# Patient Record
Sex: Female | Born: 1965 | Race: White | Hispanic: No | Smoking: Never smoker
Health system: Southern US, Community
[De-identification: ages and names within clinical notes are randomized; demographics above are authoritative.]

## PROBLEM LIST (undated history)

## (undated) DIAGNOSIS — G4730 Sleep apnea, unspecified: Secondary | ICD-10-CM

## (undated) DIAGNOSIS — G4700 Insomnia, unspecified: Secondary | ICD-10-CM

## (undated) DIAGNOSIS — F419 Anxiety disorder, unspecified: Secondary | ICD-10-CM

## (undated) DIAGNOSIS — K219 Gastro-esophageal reflux disease without esophagitis: Secondary | ICD-10-CM

## (undated) DIAGNOSIS — F32A Depression, unspecified: Secondary | ICD-10-CM

## (undated) DIAGNOSIS — K5900 Constipation, unspecified: Secondary | ICD-10-CM

## (undated) DIAGNOSIS — E785 Hyperlipidemia, unspecified: Secondary | ICD-10-CM

## (undated) DIAGNOSIS — I10 Essential (primary) hypertension: Secondary | ICD-10-CM

## (undated) DIAGNOSIS — E039 Hypothyroidism, unspecified: Secondary | ICD-10-CM

## (undated) HISTORY — PX: CHOLECYSTECTOMY: SHX55

---

## 1997-12-04 ENCOUNTER — Ambulatory Visit (HOSPITAL_COMMUNITY): Admission: RE | Admit: 1997-12-04 | Discharge: 1997-12-04 | Payer: Self-pay | Admitting: Family Medicine

## 1998-07-15 ENCOUNTER — Inpatient Hospital Stay (HOSPITAL_COMMUNITY): Admission: AD | Admit: 1998-07-15 | Discharge: 1998-07-15 | Payer: Self-pay | Admitting: Obstetrics and Gynecology

## 1998-07-28 ENCOUNTER — Inpatient Hospital Stay (HOSPITAL_COMMUNITY): Admission: AD | Admit: 1998-07-28 | Discharge: 1998-07-28 | Payer: Self-pay | Admitting: Obstetrics & Gynecology

## 1998-08-06 ENCOUNTER — Encounter: Admission: RE | Admit: 1998-08-06 | Discharge: 1998-11-04 | Payer: Self-pay | Admitting: Obstetrics and Gynecology

## 1998-10-06 ENCOUNTER — Inpatient Hospital Stay (HOSPITAL_COMMUNITY): Admission: AD | Admit: 1998-10-06 | Discharge: 1998-10-09 | Payer: Self-pay | Admitting: Obstetrics and Gynecology

## 1998-10-15 ENCOUNTER — Inpatient Hospital Stay (HOSPITAL_COMMUNITY): Admission: AD | Admit: 1998-10-15 | Discharge: 1998-10-15 | Payer: Self-pay | Admitting: Obstetrics and Gynecology

## 1998-11-29 ENCOUNTER — Other Ambulatory Visit: Admission: RE | Admit: 1998-11-29 | Discharge: 1998-11-29 | Payer: Self-pay | Admitting: Obstetrics and Gynecology

## 1999-12-02 ENCOUNTER — Other Ambulatory Visit: Admission: RE | Admit: 1999-12-02 | Discharge: 1999-12-02 | Payer: Self-pay | Admitting: Obstetrics and Gynecology

## 2000-06-12 ENCOUNTER — Emergency Department (HOSPITAL_COMMUNITY): Admission: EM | Admit: 2000-06-12 | Discharge: 2000-06-12 | Payer: Self-pay | Admitting: Emergency Medicine

## 2000-06-12 ENCOUNTER — Encounter: Payer: Self-pay | Admitting: Emergency Medicine

## 2000-11-10 ENCOUNTER — Other Ambulatory Visit: Admission: RE | Admit: 2000-11-10 | Discharge: 2000-11-10 | Payer: Self-pay | Admitting: Obstetrics and Gynecology

## 2000-11-30 ENCOUNTER — Encounter: Admission: RE | Admit: 2000-11-30 | Discharge: 2001-02-28 | Payer: Self-pay | Admitting: Obstetrics and Gynecology

## 2001-05-20 ENCOUNTER — Inpatient Hospital Stay (HOSPITAL_COMMUNITY): Admission: AD | Admit: 2001-05-20 | Discharge: 2001-05-23 | Payer: Self-pay | Admitting: Obstetrics and Gynecology

## 2002-01-26 ENCOUNTER — Other Ambulatory Visit: Admission: RE | Admit: 2002-01-26 | Discharge: 2002-01-26 | Payer: Self-pay | Admitting: Obstetrics and Gynecology

## 2003-11-05 ENCOUNTER — Other Ambulatory Visit: Admission: RE | Admit: 2003-11-05 | Discharge: 2003-11-05 | Payer: Self-pay | Admitting: Obstetrics and Gynecology

## 2004-01-11 ENCOUNTER — Ambulatory Visit (HOSPITAL_COMMUNITY): Admission: RE | Admit: 2004-01-11 | Discharge: 2004-01-11 | Payer: Self-pay | Admitting: Obstetrics and Gynecology

## 2006-01-29 ENCOUNTER — Encounter: Admission: RE | Admit: 2006-01-29 | Discharge: 2006-04-29 | Payer: Self-pay | Admitting: Orthopedic Surgery

## 2011-11-14 ENCOUNTER — Inpatient Hospital Stay: Payer: Self-pay | Admitting: Surgery

## 2011-11-14 LAB — CBC
HCT: 44.5 % (ref 35.0–47.0)
HGB: 15 g/dL (ref 12.0–16.0)
MCH: 26.2 pg (ref 26.0–34.0)
MCHC: 33.6 g/dL (ref 32.0–36.0)
MCV: 78 fL — ABNORMAL LOW (ref 80–100)
Platelet: 361 10*3/uL (ref 150–440)
RBC: 5.7 10*6/uL — ABNORMAL HIGH (ref 3.80–5.20)
RDW: 13.8 % (ref 11.5–14.5)
WBC: 12.3 10*3/uL — ABNORMAL HIGH (ref 3.6–11.0)

## 2011-11-14 LAB — COMPREHENSIVE METABOLIC PANEL
Albumin: 3.8 g/dL (ref 3.4–5.0)
Alkaline Phosphatase: 166 U/L — ABNORMAL HIGH (ref 50–136)
Anion Gap: 10 (ref 7–16)
BUN: 10 mg/dL (ref 7–18)
Bilirubin,Total: 0.4 mg/dL (ref 0.2–1.0)
Calcium, Total: 9.1 mg/dL (ref 8.5–10.1)
Chloride: 105 mmol/L (ref 98–107)
Co2: 24 mmol/L (ref 21–32)
Creatinine: 0.94 mg/dL (ref 0.60–1.30)
EGFR (African American): >60
EGFR (Non-African Amer.): >60
Glucose: 133 mg/dL — ABNORMAL HIGH (ref 65–99)
Osmolality: 279 (ref 275–301)
Potassium: 3.9 mmol/L (ref 3.5–5.1)
SGOT(AST): 53 U/L — ABNORMAL HIGH (ref 15–37)
SGPT (ALT): 49 U/L (ref 12–78)
Sodium: 139 mmol/L (ref 136–145)
Total Protein: 8.6 g/dL — ABNORMAL HIGH (ref 6.4–8.2)

## 2011-11-14 LAB — URINALYSIS, COMPLETE
Bilirubin,UR: NEGATIVE
Glucose,UR: NEGATIVE mg/dL (ref 0–75)
Ketone: NEGATIVE
Leukocyte Esterase: NEGATIVE
Nitrite: NEGATIVE
Ph: 5 (ref 4.5–8.0)
Protein: 100
RBC,UR: <1 /HPF (ref 0–5)
Specific Gravity: 1.018 (ref 1.003–1.030)
Squamous Epithelial: 2
WBC UR: 2 /HPF (ref 0–5)

## 2011-11-14 LAB — LIPASE, BLOOD: Lipase: 2759 U/L — ABNORMAL HIGH (ref 73–393)

## 2011-11-14 LAB — TROPONIN I: Troponin-I: <0.02 ng/mL

## 2011-11-14 LAB — PREGNANCY, URINE: Pregnancy Test, Urine: NEGATIVE m[IU]/mL

## 2011-11-15 LAB — COMPREHENSIVE METABOLIC PANEL
Albumin: 3 g/dL — ABNORMAL LOW (ref 3.4–5.0)
Alkaline Phosphatase: 129 U/L (ref 50–136)
Anion Gap: 7 (ref 7–16)
BUN: 7 mg/dL (ref 7–18)
Bilirubin,Total: 0.6 mg/dL (ref 0.2–1.0)
Calcium, Total: 8.2 mg/dL — ABNORMAL LOW (ref 8.5–10.1)
Chloride: 108 mmol/L — ABNORMAL HIGH (ref 98–107)
Co2: 25 mmol/L (ref 21–32)
Creatinine: 0.87 mg/dL (ref 0.60–1.30)
EGFR (African American): >60
EGFR (Non-African Amer.): >60
Glucose: 122 mg/dL — ABNORMAL HIGH (ref 65–99)
Osmolality: 279 (ref 275–301)
Potassium: 3.7 mmol/L (ref 3.5–5.1)
SGOT(AST): 20 U/L (ref 15–37)
SGPT (ALT): 29 U/L (ref 12–78)
Sodium: 140 mmol/L (ref 136–145)
Total Protein: 6.8 g/dL (ref 6.4–8.2)

## 2011-11-15 LAB — CBC WITH DIFFERENTIAL/PLATELET
Basophil #: 0.1 10*3/uL (ref 0.0–0.1)
Basophil %: 0.7 %
Eosinophil #: 0.1 10*3/uL (ref 0.0–0.7)
Eosinophil %: 1.5 %
HCT: 38.5 % (ref 35.0–47.0)
HGB: 13 g/dL (ref 12.0–16.0)
Lymphocyte #: 2.8 10*3/uL (ref 1.0–3.6)
Lymphocyte %: 29.4 %
MCH: 26.8 pg (ref 26.0–34.0)
MCHC: 33.8 g/dL (ref 32.0–36.0)
MCV: 79 fL — ABNORMAL LOW (ref 80–100)
Monocyte #: 0.6 x10 3/mm (ref 0.2–0.9)
Monocyte %: 5.8 %
Neutrophil #: 6 10*3/uL (ref 1.4–6.5)
Neutrophil %: 62.6 %
Platelet: 240 10*3/uL (ref 150–440)
RBC: 4.86 10*6/uL (ref 3.80–5.20)
RDW: 14.1 % (ref 11.5–14.5)
WBC: 9.7 10*3/uL (ref 3.6–11.0)

## 2011-11-15 LAB — AMYLASE: Amylase: 42 U/L (ref 25–115)

## 2011-11-15 LAB — LIPASE, BLOOD: Lipase: 179 U/L (ref 73–393)

## 2011-11-16 LAB — COMPREHENSIVE METABOLIC PANEL
Albumin: 2.9 g/dL — ABNORMAL LOW (ref 3.4–5.0)
Anion Gap: 9 (ref 7–16)
Calcium, Total: 8.3 mg/dL — ABNORMAL LOW (ref 8.5–10.1)
Chloride: 106 mmol/L (ref 98–107)
EGFR (African American): >60
Glucose: 103 mg/dL — ABNORMAL HIGH (ref 65–99)
Potassium: 3.6 mmol/L (ref 3.5–5.1)
SGOT(AST): 29 U/L (ref 15–37)
SGPT (ALT): 34 U/L (ref 12–78)

## 2011-11-16 LAB — CBC WITH DIFFERENTIAL/PLATELET
Basophil #: 0.1 10*3/uL (ref 0.0–0.1)
Basophil %: 0.9 %
Eosinophil %: 3.7 %
Lymphocyte #: 2.2 10*3/uL (ref 1.0–3.6)
MCH: 26.8 pg (ref 26.0–34.0)
MCHC: 33.9 g/dL (ref 32.0–36.0)
MCV: 79 fL — ABNORMAL LOW (ref 80–100)
Monocyte #: 0.5 x10 3/mm (ref 0.2–0.9)
Platelet: 231 10*3/uL (ref 150–440)
RBC: 4.81 10*6/uL (ref 3.80–5.20)
RDW: 14.3 % (ref 11.5–14.5)

## 2011-11-16 LAB — AMYLASE: Amylase: 23 U/L — ABNORMAL LOW (ref 25–115)

## 2011-11-17 LAB — COMPREHENSIVE METABOLIC PANEL
Albumin: 2.9 g/dL — ABNORMAL LOW (ref 3.4–5.0)
Anion Gap: 6 — ABNORMAL LOW (ref 7–16)
Bilirubin,Total: 0.3 mg/dL (ref 0.2–1.0)
Calcium, Total: 8.2 mg/dL — ABNORMAL LOW (ref 8.5–10.1)
Chloride: 106 mmol/L (ref 98–107)
Co2: 27 mmol/L (ref 21–32)
Creatinine: 0.77 mg/dL (ref 0.60–1.30)
EGFR (African American): >60
EGFR (Non-African Amer.): >60
Glucose: 111 mg/dL — ABNORMAL HIGH (ref 65–99)
Potassium: 3.7 mmol/L (ref 3.5–5.1)
SGOT(AST): 27 U/L (ref 15–37)
Sodium: 139 mmol/L (ref 136–145)

## 2011-11-17 LAB — LIPASE, BLOOD: Lipase: 84 U/L (ref 73–393)

## 2011-11-17 LAB — CBC WITH DIFFERENTIAL/PLATELET
Basophil #: 0.1 10*3/uL (ref 0.0–0.1)
Eosinophil #: 0.3 10*3/uL (ref 0.0–0.7)
Eosinophil %: 3.7 %
HCT: 38.6 % (ref 35.0–47.0)
MCH: 26.3 pg (ref 26.0–34.0)
MCHC: 33.6 g/dL (ref 32.0–36.0)
Monocyte #: 0.4 x10 3/mm (ref 0.2–0.9)
Neutrophil %: 64.9 %
Platelet: 233 10*3/uL (ref 150–440)
RBC: 4.94 10*6/uL (ref 3.80–5.20)
RDW: 14.1 % (ref 11.5–14.5)
WBC: 8.4 10*3/uL (ref 3.6–11.0)

## 2011-11-17 LAB — AMYLASE: Amylase: 21 U/L — ABNORMAL LOW (ref 25–115)

## 2011-12-14 ENCOUNTER — Ambulatory Visit: Payer: Self-pay | Admitting: Surgery

## 2011-12-14 LAB — BASIC METABOLIC PANEL
BUN: 10 mg/dL (ref 7–18)
Calcium, Total: 9 mg/dL (ref 8.5–10.1)
Co2: 27 mmol/L (ref 21–32)
EGFR (African American): >60
Glucose: 90 mg/dL (ref 65–99)
Osmolality: 274 (ref 275–301)
Potassium: 3.8 mmol/L (ref 3.5–5.1)

## 2011-12-14 LAB — CBC WITH DIFFERENTIAL/PLATELET
Basophil #: 0.1 10*3/uL (ref 0.0–0.1)
HCT: 42.6 % (ref 35.0–47.0)
Lymphocyte #: 2.3 10*3/uL (ref 1.0–3.6)
MCH: 26.4 pg (ref 26.0–34.0)
MCV: 78 fL — ABNORMAL LOW (ref 80–100)
Monocyte #: 0.4 x10 3/mm (ref 0.2–0.9)
Monocyte %: 4.5 %
Neutrophil #: 5.9 10*3/uL (ref 1.4–6.5)
Neutrophil %: 67.2 %
Platelet: 293 10*3/uL (ref 150–440)
RDW: 14 % (ref 11.5–14.5)
WBC: 8.9 10*3/uL (ref 3.6–11.0)

## 2011-12-14 LAB — HEPATIC FUNCTION PANEL A (ARMC)
Albumin: 3.5 g/dL (ref 3.4–5.0)
Bilirubin, Direct: <0.1 mg/dL (ref 0.00–0.20)
Bilirubin,Total: 0.4 mg/dL (ref 0.2–1.0)
SGOT(AST): 17 U/L (ref 15–37)
Total Protein: 7.9 g/dL (ref 6.4–8.2)

## 2011-12-14 LAB — LIPASE, BLOOD: Lipase: 67 U/L — ABNORMAL LOW (ref 73–393)

## 2011-12-23 ENCOUNTER — Ambulatory Visit: Payer: Self-pay | Admitting: Surgery

## 2011-12-24 LAB — CBC WITH DIFFERENTIAL/PLATELET
Basophil %: 0.2 %
Eosinophil #: 0 10*3/uL (ref 0.0–0.7)
Eosinophil %: 0 %
HCT: 36.3 % (ref 35.0–47.0)
HGB: 12.5 g/dL (ref 12.0–16.0)
Lymphocyte #: 1.1 10*3/uL (ref 1.0–3.6)
Lymphocyte %: 12.9 %
MCH: 27.1 pg (ref 26.0–34.0)
MCHC: 34.4 g/dL (ref 32.0–36.0)
MCV: 79 fL — ABNORMAL LOW (ref 80–100)
Monocyte #: 0.2 x10 3/mm (ref 0.2–0.9)
Neutrophil #: 7.3 10*3/uL — ABNORMAL HIGH (ref 1.4–6.5)
RDW: 14.4 % (ref 11.5–14.5)

## 2011-12-24 LAB — COMPREHENSIVE METABOLIC PANEL
Albumin: 3.1 g/dL — ABNORMAL LOW (ref 3.4–5.0)
Calcium, Total: 8.4 mg/dL — ABNORMAL LOW (ref 8.5–10.1)
Chloride: 105 mmol/L (ref 98–107)
Co2: 24 mmol/L (ref 21–32)
Creatinine: 0.93 mg/dL (ref 0.60–1.30)
EGFR (African American): >60
Osmolality: 279 (ref 275–301)
Potassium: 4.3 mmol/L (ref 3.5–5.1)
Sodium: 139 mmol/L (ref 136–145)
Total Protein: 6.8 g/dL (ref 6.4–8.2)

## 2011-12-25 LAB — PATHOLOGY REPORT

## 2013-05-30 ENCOUNTER — Emergency Department: Payer: Self-pay | Admitting: Emergency Medicine

## 2014-07-03 NOTE — H&P (Signed)
PATIENT NAME:  Veronica Harding, BOUTELLE MR#:  [id number] DATE OF BIRTH:  29-Oct-1965  DATE OF ADMISSION:  11/14/2011  HISTORY OF PRESENT ILLNESS: Ms. Goelz is an otherwise, healthy 49 year old white female who began experiencing vomiting last night at about 10:00 p.m. (14 hours ago). She has vomited 5 or 6 times in the last 14 hours and some of her vomiting has awoken her. In addition to this, she has had significant epigastric pain that radiates down to the supraumbilical area and across both sides of her upper abdomen and along her spine in her back. She denies fever, chills, change in the color of her urine, eyes, skin, and stool. She did have a bowel movement within the last 24 hours. She has never had an episode like this in the past.   PAST MEDICAL HISTORY:  1. Morbid obesity.  2. Remote history of depression.   MEDICATIONS: None.   ALLERGIES: None.   SOCIAL HISTORY: The patient has been divorced for eight years. She lives with her two boys that are 75 and 47 years old, respectively. She has social support system with her sister and her mother living close by. She does not smoke cigarettes and never has. She does not drink alcohol and never has. She has not worked since 2005. She has done occasional odd jobs and went back to school initially, but essentially has been unemployed since her divorce.   REVIEW OF SYSTEMS: Negative for 10 systems except as mentioned above in the history of present illness. Specifically, the patient denies any symptoms of jaundice or hyperbilirubinemia or urea. The only real positive in the review of systems is that the patient has had intermittent depression and did use multiple medications for depression in the past that were dispensed by her family physician, but she did not feel that any of these worked well. She has not had any significant counseling for her depression.   FAMILY HISTORY: Noncontributory.   PHYSICAL EXAMINATION:  GENERAL: Morbidly obese young to  middle-aged white female lying comfortably in the Emergency Department stretcher. Height 5 feet, 0 inches, weight 285 pounds, BMI 55.7.   VITAL SIGNS: Temperature 98.0, pulse 95, respirations 18, blood pressure 211/104, oxygen saturation 98%.   HEENT: Pupils equally round and reactive to light. Extraocular movements intact. Sclerae anicteric. Oropharynx clear. Hearing intact to voice. Mucous membranes moist.   NECK: Supple with no jugular venous distention or tracheal deviation.   HEART: Regular rate and rhythm with no murmurs or rubs.   LUNGS: Clear to auscultation with normal respiratory effort bilaterally.   ABDOMEN: Obese and mildly tender to deep palpation in the epigastrium. There is no hepatosplenomegaly or other masses.   EXTREMITIES: No edema with normal capillary refill bilaterally.   NEUROLOGIC: Cranial nerves II through XII, motor and sensation grossly intact.   PSYCHIATRIC: Alert and oriented x4. Appropriate affect.   LABORATORY, DIAGNOSTIC AND RADIOLOGICAL DATA: Electrolytes normal. SGOT 53. Alkaline phosphatase 166, bilirubin 0.4, amylase 2,759. White blood cell count 12, hemoglobin 15, hematocrit 44.5%, platelet count 361,000. Urinalysis and urine pregnancy test are normal. Ultrasound of the abdomen reveals a stone lodged in the neck of the gallbladder with no pericholecystic fluid, but the gallbladder wall is minimally thickened at 4 mm and the common bile duct is dilated at 7.5 mm. There is no intrahepatic ductal dilatation.   ASSESSMENT:  1. Gallstone pancreatitis with possible associated hydrops of the gallbladder.  2. Morbid obesity.  3. Possible undiagnosed hypertension.     PLAN: Admit  to the hospital for IV fluid hydration, n.p.o. status, and once her gallstone pancreatitis resolves, an interval laparoscopic cholecystectomy.    ____________________________ Deago Burruss F. Dierks Wach, MD wfm:ap Claude Manges: 11/14/2011 12:12:36 ET T: 11/14/2011 12:32:25  ET JOB#: [id number]  cc: Claude MangesWilliam F. Raylea Adcox, MD, <Dictator>  Claude MangesWILLIAM F Aidynn Krenn MD ELECTRONICALLY SIGNED 11/15/2011 11:20

## 2014-07-03 NOTE — Discharge Summary (Signed)
PATIENT NAME:  Veronica Harding, Nana M MR#:  [id number] DATE OF BIRTH:  1965-12-10  DATE OF ADMISSION:  11/14/2011 DATE OF DISCHARGE:  11/17/2011  DISCHARGE DIAGNOSES:  1. Biliary pancreatitis. 2. Gallstones.  3. Morbid obesity. 4. Depression.   PROCEDURES: None.   CONSULTANTS: None.   HISTORY OF PRESENT ILLNESS/HOSPITAL COURSE: This is a patient with laboratory, physical finding, and history suggestive of biliary pancreatitis. She was admitted to the hospital with an elevated lipase, which promptly returned to normal as did her liver function tests. Because of her low albumin, significant pancreatitis, and morbid obesity it was decided that with the patient tolerating a low fat diet that she would be discharged in stable condition to follow up in my office in one week, at which time we would discuss elective laparoscopic cholecystectomy with cholangiography. The rationale for this was discussed. Due to her significant pancreatitis, even though her lipase is back to normal, we suspect that she has significant inflammatory process and that surgery risks including conversion to an open procedure would be higher at this time. The patient understood and agreed with this plan. She will be discharged with Vicodin for pain p.r.n. and to resume her regular medications and follow up in my office in 10 days.  ____________________________ Adah Salvageichard E. Excell Seltzerooper, MD rec:rbg D: 11/17/2011 10:28:18 ET T: 11/18/2011 11:12:33 ET JOB#: [id number]  cc: Adah Salvageichard E. Excell Seltzerooper, MD, <Dictator> Lattie HawICHARD E Jacqulyne Gladue MD ELECTRONICALLY SIGNED 11/18/2011 18:38

## 2014-07-03 NOTE — Op Note (Signed)
PATIENT NAME:  Veronica Harding, Veronica Harding MR#:  [id number] DATE OF BIRTH:  12-Apr-1965  DATE OF PROCEDURE:  12/23/2011  PREOPERATIVE DIAGNOSIS: Biliary colic with history of biliary pancreatitis.   POSTOPERATIVE DIAGNOSIS: Biliary colic with history of biliary pancreatitis.   PROCEDURE: Laparoscopic cholecystectomy with C-arm fluoroscopic cholangiography.   SURGEON: Karolee Meloni E. Excell Seltzerooper, MD  ASSISTANT: Ronaldo MiyamotoKyle Puscy, PAS   ANESTHESIA: General with endotracheal tube.  INDICATIONS: This is a patient with episodes of biliary pancreatitis. She has not had any attacks in approximately one month. She is here for elective laparoscopic cholecystectomy with cholangiography.   Preoperatively we discussed rationale for surgery, the options of observation, risk of bleeding, infection, recurrence of disease, retained common bile duct stone, and bile duct leak any of which could require further surgery and/or ERCP, stent, and papillotomy. This was all reviewed for she and her husband and other family members in the preop holding area. She understood and agreed to proceed.   FINDINGS: C-arm fluoroscopic cholangiography demonstrated good flow in the duodenum. No intraluminal filling defects. The pancreatic duct filled. The cystic duct had been cannulated and the proximal bile ducts were well identified.   DESCRIPTION OF PROCEDURE: The patient was induced to general anesthesia, given IV antibiotics, and VTE prophylaxis was in place. She was prepped and draped in a sterile fashion. Marcaine was infiltrated in the skin and subcutaneous tissues around the periumbilical area. Because of her morbid obesity, the extra long bariatric Veress needle was utilized through an incision, in the supraumbilical area, to obtain a pneumoperitoneum. Once this was obtained, an extra long bariatric 5 mm port was placed and the camera was utilized to explore the abdominal cavity. No sign of bowel injury or adhesions were identified.   Under direct  vision, a 10 mm epigastric port and two lateral 5 mm ports were placed. The gallbladder was placed on tension. The peritoneum over the infundibulum was incised bluntly. The cystic duct gallbladder junction was well identified. The cystic artery was doubly clipped and divided and then the cystic duct at the infundibulum was clipped and incised through a separate incision and an Angiocath cholangiogram catheter was placed. C-arm fluoroscopic cholangiography demonstrated the above. The cholangiogram catheter was removed. The cystic duct was doubly clipped and divided and then a second branch of the cystic artery was doubly clipped and divided and the gallbladder was taken from the gallbladder fossa with electrocautery and passed out through the epigastric port site with the aid of an Endo Catch bag. The area was checked for hemostasis and found to be adequate. There was no sign of bleeding, bile leak, or bowel injury. Therefore with the camera in the epigastric site to view back to the periumbilical site, there was no sign of bowel injury or adhesions and pneumoperitoneum was released. All ports were removed. An attempt at closure of the epigastric port with figure-of-eight 0 Vicryls was performed. This was made extremely difficult by her massive morbid obesity, but I believe the fascia was approximated. Then 4-0 subcuticular Monocryl was used on all skin edges. Steri-Strips, Mastisol, and sterile dressing was placed.   The patient tolerated the procedure well. There were no complications. She was taken to the recovery room in stable condition to be admitted for observation. ____________________________ Adah Salvageichard E. Excell Seltzerooper, MD rec:slb D: 12/23/2011 09:22:39 ET T: 12/23/2011 09:31:41 ET JOB#: [id number]  cc: Adah Salvageichard E. Excell Seltzerooper, MD, <Dictator> Lattie HawICHARD E Emmelina Mcloughlin MD ELECTRONICALLY SIGNED 12/23/2011 15:37

## 2014-07-03 NOTE — Discharge Summary (Signed)
PATIENT NAME:  Veronica Harding, Neala M MR#:  [id number] DATE OF BIRTH:  1965/12/17  DATE OF ADMISSION:  12/23/2011 DATE OF DISCHARGE:  12/24/2011  DIAGNOSES:  1. History of biliary pancreatitis.  2. Gallstones.  3. Morbid obesity.  4. Hypertension.   PROCEDURES: Laparoscopic cholecystectomy with cholangiography.   HISTORY OF PRESENT ILLNESS/HOSPITAL COURSE: This is a patient who had been admitted to the hospital approximately one month ago with a history of biliary pancreatitis. That has resolved. She has been treated for the new onset of hypertension as an outpatient and is now cleared for surgery. She was taken to the operating room where laparoscopic cholecystectomy with C-arm fluoroscopic cholangiography was performed. She made an uncomplicated postoperative recovery, was discharged stable condition, tolerating a regular diet with Vicodin for pain and to follow up in my office in 10 days.     ____________________________ Adah Salvageichard E. Excell Seltzerooper, MD rec:vtd D: 12/24/2011 13:27:59 ET T: 12/25/2011 10:37:04 ET JOB#: [id number]  cc: Adah Salvageichard E. Excell Seltzerooper, MD, <Dictator> Lattie HawICHARD E Prescilla Monger MD ELECTRONICALLY SIGNED 12/25/2011 17:56

## 2014-07-03 NOTE — H&P (Signed)
PATIENT NAME:  Veronica Harding, Domini M MR#:  [id number] DATE OF BIRTH:  Sep 09, 1965  DATE OF ADMISSION:  12/23/2011  CHIEF COMPLAINT: Biliary pancreatitis.   HISTORY OF PRESENT ILLNESS: This is a patient who was recently admitted to the hospital with a diagnosis of biliary pancreatitis, which has resolved. She is here for elective laparoscopic cholecystectomy with cholangiography.   PAST MEDICAL HISTORY:  1. Biliary pancreatitis.  2. Morbid obesity. 3. Depression.  MEDICATIONS: None, with the exception of analgesics.   ALLERGIES: No known drug allergies.   FAMILY HISTORY: Noncontributory.   SOCIAL HISTORY: The patient is a nonsmoker and does not drink alcohol.   REVIEW OF SYSTEMS: A 10 system review has been performed and negative with the exception of that mentioned in the history of present illness.   PHYSICAL EXAMINATION:   GENERAL: Morbidly obese female patient. BMI is calculated at 57, 292 pounds.   HEENT: No scleral icterus.   NECK: No palpable neck nodes.   CHEST: Clear to auscultation.   CARDIAC: Regular rate and rhythm.   ABDOMEN: Soft and nontender.   EXTREMITIES: No edema.   NEUROLOGIC: Grossly intact.   INTEGUMENT: No jaundice.   LABS: Past documented laboratory values for biliary pancreatitis: Most recently on 12/14/2011:  White blood cell count was 8.9 and hemoglobin and hematocrit 14 and 43. Normal liver function tests. Lipase 67.   ASSESSMENT AND PLAN: This is a patient with known gallstones and history of biliary pancreatitis. She is here for elective laparoscopic cholecystectomy with cholangiography. The rationale for offering surgery has been discussed. The option of observation has been reviewed and the risks of bleeding, infection, recurrence of symptoms, failure to resolve her symptoms, open procedure, bile duct damage, bile duct leak, retained common bile duct stone any of which could require further surgery and/or ERCP, stent, and papillotomy have all been  reviewed. She understood and agreed to proceed.  ____________________________ Adah Salvageichard E. Excell Seltzerooper, MD rec:slb D: 12/22/2011 22:14:00 ET T: 12/23/2011 06:52:46 ET JOB#: [id number]  cc: Adah Salvageichard E. Excell Seltzerooper, MD, <Dictator> Lattie HawICHARD E Melrose Kearse MD ELECTRONICALLY SIGNED 12/23/2011 15:36

## 2015-07-24 ENCOUNTER — Encounter: Payer: Self-pay | Admitting: *Deleted

## 2015-07-24 ENCOUNTER — Emergency Department
Admission: EM | Admit: 2015-07-24 | Discharge: 2015-07-24 | Disposition: A | Discharge status: Home / Self Care | Payer: Medicaid Other | Attending: Student | Admitting: Student

## 2015-07-24 DIAGNOSIS — L02212 Cutaneous abscess of back [any part, except buttock]: Secondary | ICD-10-CM | POA: Diagnosis present

## 2015-07-24 MED ORDER — HYDROCODONE-ACETAMINOPHEN 5-325 MG PO TABS: 1.0000 | ORAL_TABLET | ORAL | Status: DC | PRN

## 2015-07-24 MED ORDER — SULFAMETHOXAZOLE-TRIMETHOPRIM 800-160 MG PO TABS: 1.0000 | ORAL_TABLET | Freq: Two times a day (BID) | ORAL | Status: DC

## 2015-07-24 NOTE — Discharge Instructions (Signed)
Abscess An abscess (boil or furuncle) is an infected area on or under the skin. This area is filled with yellowish-white fluid (pus) and other material (debris). HOME CARE   Only take medicines as told by your doctor.  If you were given antibiotic medicine, take it as directed. Finish the medicine even if you start to feel better.  If gauze is used, follow your doctor's directions for changing the gauze.  To avoid spreading the infection:  Keep your abscess covered with a bandage.  Wash your hands well.  Do not share personal care items, towels, or whirlpools with others.  Avoid skin contact with others.  Keep your skin and clothes clean around the abscess.  Keep all doctor visits as told. GET HELP RIGHT AWAY IF:   You have more pain, puffiness (swelling), or redness in the wound site.  You have more fluid or blood coming from the wound site.  You have muscle aches, chills, or you feel sick.  You have a fever. MAKE SURE YOU:   Understand these instructions.  Will watch your condition.  Will get help right away if you are not doing well or get worse.   This information is not intended to replace advice given to you by your health care provider. Make sure you discuss any questions you have with your health care provider.   Document Released: 08/19/2007 Document Revised: 09/01/2011 Document Reviewed: 05/16/2011 Elsevier Interactive Patient Education 2016 [employer]   Apply warm compresses to the area frequently. Begin taking medication only as directed. Begin antibiotic and finish the entire 10 day course. Make an appointment with your family doctor for further treatment.

## 2015-07-24 NOTE — ED Notes (Signed)
States red hard area on her right back she noticed Saturday, states her sister tried to lance it last night but nothing came out, states itching at site, redness to site upon assessment

## 2015-07-24 NOTE — ED Provider Notes (Signed)
[facility] Emergency Department Provider Note  ____________________________________________  Time seen: Approximately 11:05 AM  I have reviewed the triage vital signs and the nursing notes.   HISTORY  Chief Complaint Abscess   HPI Veronica Harding is a 50 y.o. female is here with complaint of red hard area on her back that she noticed on Saturday. She states that her sister tried to "lance it last night" but nothing came out. Patient states that there is itching at the site and it continues to be red. Patient states that she has pain at that area. She is uncertain whether or not she has had abscesses before. She denies any fever or chills. She rates her pain as an 8/10.   History reviewed. No pertinent past medical history.  There are no active problems to display for this patient.   History reviewed. No pertinent past surgical history.  Current Outpatient Rx  Name  Route  Sig  Dispense  Refill  . HYDROcodone-acetaminophen (NORCO/VICODIN) 5-325 MG tablet   Oral   Take 1 tablet by mouth every 4 (four) hours as needed for moderate pain.   20 tablet   0   . sulfamethoxazole-trimethoprim (BACTRIM DS,SEPTRA DS) 800-160 MG tablet   Oral   Take 1 tablet by mouth 2 (two) times daily.   20 tablet   0     Allergies Review of patient's allergies indicates no known allergies.  History reviewed. No pertinent family history.  Social History Social History  Substance Use Topics  . Smoking status: Never Smoker   . Smokeless tobacco: None  . Alcohol Use: None    Review of Systems Constitutional: No fever/chills Cardiovascular: Denies chest pain. Respiratory: Denies shortness of breath. Gastrointestinal: No abdominal pain.  No nausea, no vomiting.   Musculoskeletal: Negative for back pain. Skin: Positive abscess. Neurological: Negative for headaches, focal weakness or numbness.  10-point ROS otherwise  negative.  ____________________________________________   PHYSICAL EXAM:  VITAL SIGNS: ED Triage Vitals  Enc Vitals Group     BP 07/24/15 1033 179/101 mmHg     Pulse Rate 07/24/15 1033 113     Resp 07/24/15 1033 18     Temp 07/24/15 1033 100.1 F (37.8 C)     Temp Source 07/24/15 1033 Oral     SpO2 07/24/15 1033 97 %     Weight 07/24/15 1033 285 lb (129.275 kg)     Height 07/24/15 1033 5' (1.524 m)     Head Cir --      Peak Flow --      Pain Score 07/24/15 1034 8     Pain Loc --      Pain Edu? --      Excl. in GC? --     Constitutional: Alert and oriented. Well appearing and in no acute distress. Eyes: Conjunctivae are normal. PERRL. EOMI. Head: Atraumatic. Nose: No congestion/rhinnorhea. Neck: No stridor.   Cardiovascular: Normal rate, regular rhythm. Grossly normal heart sounds.  Good peripheral circulation. Respiratory: Normal respiratory effort.  No retractions. Lungs CTAB. Musculoskeletal: Moves upper and lower extremities without any difficulty. Normal gait was noted. Neurologic:  Normal speech and language. No gross focal neurologic deficits are appreciated. No gait instability. Skin:  Skin is warm, dry. There is an erythematous, tender, warm and firm nodule on the right torso, back. There is a single puncture without any evidence of drainage. There is also surrounding cellulitis in this area. Psychiatric: Mood and affect are normal. Speech and behavior  are normal.  ____________________________________________   LABS (all labs ordered are listed, but only abnormal results are displayed)  Labs Reviewed - No data to display   PROCEDURES  Procedure(s) performed: None  Critical Care performed: No  ____________________________________________   INITIAL IMPRESSION / ASSESSMENT AND PLAN / ED COURSE  Pertinent labs & imaging results that were available during my care of the patient were reviewed by me and considered in my medical decision making (see chart for  details).  Patient is placed on Septra DS twice a day for 10 days along with Norco as needed for severe pain. Patient is encouraged to use warm compresses frequently. Patient will follow-up with her primary care or return to the emergency room if area needs to be lanced. ____________________________________________   FINAL CLINICAL IMPRESSION(S) / ED DIAGNOSES  Final diagnoses:  Cutaneous abscess of back excluding buttocks      NEW MEDICATIONS STARTED DURING THIS VISIT:  Discharge Medication List as of 07/24/2015 11:09 AM    START taking these medications   Details  HYDROcodone-acetaminophen (NORCO/VICODIN) 5-325 MG tablet Take 1 tablet by mouth every 4 (four) hours as needed for moderate pain., Starting 07/24/2015, Until Discontinued, Print    sulfamethoxazole-trimethoprim (BACTRIM DS,SEPTRA DS) 800-160 MG tablet Take 1 tablet by mouth 2 (two) times daily., Starting 07/24/2015, Until Discontinued, Print         Note:  This document was prepared using Dragon voice recognition software and may include unintentional dictation errors.    Tommi RumpsRhonda L Summers, PA-C 07/24/15 1710  Minna AntisKevin Paduchowski, MD 07/26/15 1504

## 2016-11-16 ENCOUNTER — Encounter: Payer: Self-pay | Admitting: Emergency Medicine

## 2016-11-16 ENCOUNTER — Emergency Department
Admission: EM | Admit: 2016-11-16 | Discharge: 2016-11-16 | Disposition: A | Discharge status: Home / Self Care | Payer: Medicaid Other | Attending: Emergency Medicine | Admitting: Emergency Medicine

## 2016-11-16 ENCOUNTER — Emergency Department: Payer: Medicaid Other

## 2016-11-16 DIAGNOSIS — M25561 Pain in right knee: Secondary | ICD-10-CM | POA: Diagnosis present

## 2016-11-16 DIAGNOSIS — M1711 Unilateral primary osteoarthritis, right knee: Secondary | ICD-10-CM | POA: Insufficient documentation

## 2016-11-16 NOTE — ED Notes (Signed)
Pt reports that she is having right knee pain - she is having difficulty bearing weight on the right knee and states that sitting with the knee bent hurts too - the pain has been present since yesterday but on going for the last 2 months - denies redness or swelling

## 2016-11-16 NOTE — ED Triage Notes (Signed)
R knee pain x 2 months, denies injury.

## 2016-11-16 NOTE — Discharge Instructions (Signed)
Follow-up with your primary care doctor if any continued problems. Continue taking meloxicam as prescribed by your doctor.

## 2016-11-16 NOTE — ED Provider Notes (Signed)
[facility] Emergency Department Provider Note   ____________________________________________   First MD Initiated Contact with Patient 11/16/16 1118     (approximate)  I have reviewed the triage vital signs and the nursing notes.   HISTORY  Chief Complaint Knee Pain   HPI Veronica Harding is a 51 y.o. female is here complaining of right knee pain for the last 2 months. Patient states that she has not had an injury and that gradually the pain has gotten worse. Patient states that her PCP wrote a prescription for a medication that did not help. She understood that this may have been a muscle relaxant. She states that this morning pain is increased. She rates her pain as an 8 out of 10.  History reviewed. No pertinent past medical history.  There are no active problems to display for this patient.   History reviewed. No pertinent surgical history.  Prior to Admission medications   Medication Sig Start Date End Date Taking? Authorizing Provider  HYDROcodone-acetaminophen (NORCO/VICODIN) 5-325 MG tablet Take 1 tablet by mouth every 4 (four) hours as needed for moderate pain. 07/24/15   Tommi Rumps, PA-C  sulfamethoxazole-trimethoprim (BACTRIM DS,SEPTRA DS) 800-160 MG tablet Take 1 tablet by mouth 2 (two) times daily. 07/24/15   Tommi Rumps, PA-C    Allergies Patient has no known allergies.  No family history on file.  Social History Social History  Substance Use Topics  . Smoking status: Never Smoker  . Smokeless tobacco: Not on file  . Alcohol use Not on file    Review of Systems Constitutional: No fever/chills Cardiovascular: Denies chest pain. Respiratory: Denies shortness of breath. Gastrointestinal:  No nausea, no vomiting.  Musculoskeletal: Positive right knee pain. Skin: Negative for rash. Negative for erythema. Neurological: Negative for  focal weakness or numbness. ____________________________________________   PHYSICAL  EXAM:  VITAL SIGNS: ED Triage Vitals  Enc Vitals Group     BP 11/16/16 1109 (!) 176/80     Pulse Rate 11/16/16 1109 93     Resp 11/16/16 1109 18     Temp 11/16/16 1109 98 F (36.7 C)     Temp Source 11/16/16 1109 Oral     SpO2 11/16/16 1109 99 %     Weight 11/16/16 1110 280 lb (127 kg)     Height 11/16/16 1110 5' (1.524 m)     Head Circumference --      Peak Flow --      Pain Score 11/16/16 1109 8     Pain Loc --      Pain Edu? --      Excl. in GC? --    Constitutional: Alert and oriented. Well appearing and in no acute distress. Large body habitus. Eyes: Conjunctivae are normal.  Head: Atraumatic. Neck: No stridor.   Cardiovascular: Normal rate, regular rhythm. Grossly normal heart sounds.  Good peripheral circulation. Respiratory: Normal respiratory effort.  No retractions. Lungs CTAB. Musculoskeletal: On examination the right knee there is no gross deformity in comparison with the left. No effusion is appreciated. There is some crepitus noted with range of motion. There is tenderness on the medial aspect greater than the lateral. Ligaments are stable bilaterally. Skin is warm and dry and no erythema or warmth is noted. Neurologic:  Normal speech and language. No gross focal neurologic deficits are appreciated.  Skin:  Skin is warm, dry and intact. No rash noted. Psychiatric: Mood and affect are normal. Speech and behavior are normal.  ____________________________________________  LABS (all labs ordered are listed, but only abnormal results are displayed)  Labs Reviewed - No data to display  RADIOLOGY  Dg Knee Complete 4 Views Right  Result Date: 11/16/2016 CLINICAL DATA:  Patient reports left anterior knee pain x2 months with pain increasing in past 3 days. No known injury to left knee. Limited ROM. No previous injury or surgery to left knee. EXAM: RIGHT KNEE - COMPLETE 4+ VIEW COMPARISON:  None. FINDINGS: Moderate medial compartmental articular space narrowing with  tricompartmental spurring. No significant knee effusion. No additional significant findings. IMPRESSION: 1. Moderate osteoarthritis. Electronically Signed   By: Gaylyn RongWalter  Liebkemann M.D.   On: 11/16/2016 12:19    ____________________________________________   PROCEDURES  Procedure(s) performed: None  Procedures  Critical Care performed: No  ____________________________________________   INITIAL IMPRESSION / ASSESSMENT AND PLAN / ED COURSE  Pertinent labs & imaging results that were available during my care of the patient were reviewed by me and considered in my medical decision making (see chart for details).  X-rays were reviewed and patient was made aware that she has osteoarthritis of her right knee. She currently has a prescription for meloxicam written by her PCP along with Robaxin which she thought was the pain medication. She is continue with the meloxicam and follow-up with her PCP if any continued problems with her knee. We discussed possible referral to an orthopedist if not improving.   ___________________________________________   FINAL CLINICAL IMPRESSION(S) / ED DIAGNOSES  Final diagnoses:  Osteoarthritis of right knee, unspecified osteoarthritis type      NEW MEDICATIONS STARTED DURING THIS VISIT:  Discharge Medication List as of 11/16/2016 12:34 PM       Note:  This document was prepared using Dragon voice recognition software and may include unintentional dictation errors.    Tommi RumpsSummers, Rhonda L, PA-C 11/16/16 1405    Governor RooksLord, Rebecca, MD 11/16/16 [phone]

## 2017-10-14 DIAGNOSIS — S93629A Sprain of tarsometatarsal ligament of unspecified foot, initial encounter: Secondary | ICD-10-CM | POA: Insufficient documentation

## 2021-04-25 ENCOUNTER — Encounter: Payer: Self-pay | Admitting: Gastroenterology

## 2021-04-27 ENCOUNTER — Encounter: Payer: Self-pay | Admitting: Gastroenterology

## 2021-04-27 NOTE — H&P (Signed)
Pre-Procedure H&P   Patient ID: Veronica Harding is a 56 y.o. female.  Gastroenterology Provider: Jaynie Collins, DO  Referring Provider: Jacob Moores, PA PCP: [facility]  Date: 04/28/2021  HPI Ms. Veronica Harding is a 56 y.o. female who presents today for Colonoscopy for surveillance- personal h/o polyps, fhx crc (Father and PGF). Deals with chronic constipation that has improved with otc products. No melena/hematochezia. No abdominal pain.  Phx polyps- unk type- las colonoscopy 7 years ago- no records  Past Medical History:  Diagnosis Date   Anxiety    GERD (gastroesophageal reflux disease)    Hypothyroidism    Sleep apnea     Past Surgical History:  Procedure Laterality Date   CHOLECYSTECTOMY      Family History Father and PGF with CRC No h/o GI disease or malignancy  Review of Systems  Constitutional:  Negative for activity change, appetite change, chills, diaphoresis, fatigue, fever and unexpected weight change.  HENT:  Negative for trouble swallowing and voice change.   Respiratory:  Negative for shortness of breath and wheezing.   Cardiovascular:  Negative for chest pain, palpitations and leg swelling.  Gastrointestinal:  Positive for constipation. Negative for abdominal distention, abdominal pain, anal bleeding, blood in stool, diarrhea, nausea, rectal pain and vomiting.  Skin:  Negative for color change and pallor.  Neurological:  Negative for dizziness, syncope and weakness.  Psychiatric/Behavioral:  Negative for confusion.   All other systems reviewed and are negative.   Medications No current facility-administered medications on file prior to encounter.   Current Outpatient Medications on File Prior to Encounter  Medication Sig Dispense Refill   albuterol (VENTOLIN HFA) 108 (90 Base) MCG/ACT inhaler Inhale 2 puffs into the lungs every 6 (six) hours as needed for wheezing or shortness of breath. 2 puffs every 4 to 6 hours as needed      amphetamine-dextroamphetamine (ADDERALL) 5 MG tablet Take 5 mg by mouth 2 (two) times daily with a meal.     aspirin EC 81 MG tablet Take 81 mg by mouth daily. Swallow whole.     buPROPion (WELLBUTRIN XL) 150 MG 24 hr tablet Take 150 mg by mouth daily.     desvenlafaxine (PRISTIQ) 50 MG 24 hr tablet Take 50 mg by mouth daily.     ergocalciferol (VITAMIN D2) 1.25 MG (50000 UT) capsule Take 50,000 Units by mouth once a week.     FLUoxetine (PROZAC) 20 MG tablet Take 20 mg by mouth daily.     HYDROcodone-acetaminophen (NORCO/VICODIN) 5-325 MG tablet Take 1 tablet by mouth every 4 (four) hours as needed for moderate pain. 20 tablet 0   Indacaterol-Glycopyrrolate (UTIBRON NEOHALER) 27.5-15.6 MCG CAPS Place 1 capsule into inhaler and inhale 2 (two) times daily as needed.     levothyroxine (SYNTHROID) 50 MCG tablet Take 50 mcg by mouth daily.     lisinopril (ZESTRIL) 20 MG tablet Take 20 mg by mouth daily.     meloxicam (MOBIC) 7.5 MG tablet Take 7.5 mg by mouth 2 (two) times daily as needed for pain.     metFORMIN (GLUCOPHAGE) 500 MG tablet Take 500 mg by mouth 2 (two) times daily with a meal.     methocarbamol (ROBAXIN) 750 MG tablet Take 750 mg by mouth 2 (two) times daily as needed.     pravastatin (PRAVACHOL) 20 MG tablet Take 20 mg by mouth daily.     traZODone (DESYREL) 50 MG tablet Take 50 mg by mouth at bedtime.  sulfamethoxazole-trimethoprim (BACTRIM DS,SEPTRA DS) 800-160 MG tablet Take 1 tablet by mouth 2 (two) times daily. (Patient not taking: Reported on 04/28/2021) 20 tablet 0    Pertinent medications related to GI and procedure were reviewed by me with the patient prior to the procedure   Current Facility-Administered Medications:    0.9 %  sodium chloride infusion, , Intravenous, Continuous, Jaynie Collins, DO      Allergies  Allergen Reactions   Lactase-Lactobacillus    Allergies were reviewed by me prior to the procedure  Objective    Vitals:   04/28/21 0719   BP: 131/85  Pulse: 80  Resp: 15  Temp: 97.6 F (36.4 C)  TempSrc: Temporal  SpO2: 98%  Weight: 107 kg  Height: 5' (1.524 m)    Physical Exam Vitals and nursing note reviewed.  Constitutional:      General: She is not in acute distress.    Appearance: Normal appearance. She is obese. She is not ill-appearing, toxic-appearing or diaphoretic.  HENT:     Head: Normocephalic and atraumatic.     Nose: Nose normal.     Mouth/Throat:     Mouth: Mucous membranes are moist.     Pharynx: Oropharynx is clear.  Eyes:     General: No scleral icterus.    Extraocular Movements: Extraocular movements intact.  Cardiovascular:     Rate and Rhythm: Normal rate and regular rhythm.     Heart sounds: Normal heart sounds. No murmur heard.   No friction rub. No gallop.  Pulmonary:     Effort: Pulmonary effort is normal. No respiratory distress.     Breath sounds: Normal breath sounds. No wheezing, rhonchi or rales.  Abdominal:     General: Abdomen is flat. Bowel sounds are normal. There is no distension.     Palpations: Abdomen is soft.     Tenderness: There is no abdominal tenderness. There is no guarding or rebound.  Musculoskeletal:     Cervical back: Neck supple.     Right lower leg: No edema.     Left lower leg: No edema.  Skin:    General: Skin is warm and dry.     Coloration: Skin is not jaundiced or pale.  Neurological:     General: No focal deficit present.     Mental Status: She is alert and oriented to person, place, and time. Mental status is at baseline.  Psychiatric:        Mood and Affect: Mood normal.        Behavior: Behavior normal.        Thought Content: Thought content normal.        Judgment: Judgment normal.     Assessment:  Ms. Veronica Harding is a 56 y.o. female  who presents today for Colonoscopy for surveillance- personal h/o polyps, fhx crc (Father and PGF).  Plan:  Colonoscopy with possible intervention today  Colonoscopy with possible biopsy, control  of bleeding, polypectomy, and interventions as necessary has been discussed with the patient/patient representative. Informed consent was obtained from the patient/patient representative after explaining the indication, nature, and risks of the procedure including but not limited to death, bleeding, perforation, missed neoplasm/lesions, cardiorespiratory compromise, and reaction to medications. Opportunity for questions was given and appropriate answers were provided. Patient/patient representative has verbalized understanding is amenable to undergoing the procedure.   Jaynie Collins, DO  [facility]  Portions of the record may have been created with voice recognition software. Occasional wrong-word  or 'sound-a-like' substitutions may have occurred due to the inherent limitations of voice recognition software.  Read the chart carefully and recognize, using context, where substitutions may have occurred.

## 2021-04-28 ENCOUNTER — Other Ambulatory Visit: Payer: Self-pay

## 2021-04-28 ENCOUNTER — Ambulatory Visit
Admission: RE | Admit: 2021-04-28 | Discharge: 2021-04-28 | Disposition: A | Discharge status: Home / Self Care | Payer: Medicaid Other | Source: Ambulatory Visit | Attending: Gastroenterology | Admitting: Gastroenterology

## 2021-04-28 ENCOUNTER — Ambulatory Visit: Payer: Medicaid Other | Admitting: Certified Registered Nurse Anesthetist

## 2021-04-28 ENCOUNTER — Encounter
Admission: RE | Disposition: A | Discharge status: Home / Self Care | Payer: Self-pay | Source: Ambulatory Visit | Attending: Gastroenterology

## 2021-04-28 ENCOUNTER — Encounter: Payer: Self-pay | Admitting: Gastroenterology

## 2021-04-28 DIAGNOSIS — G4730 Sleep apnea, unspecified: Secondary | ICD-10-CM | POA: Diagnosis not present

## 2021-04-28 DIAGNOSIS — K219 Gastro-esophageal reflux disease without esophagitis: Secondary | ICD-10-CM | POA: Insufficient documentation

## 2021-04-28 DIAGNOSIS — E039 Hypothyroidism, unspecified: Secondary | ICD-10-CM | POA: Diagnosis not present

## 2021-04-28 DIAGNOSIS — Z800 Family history of malignant neoplasm of digestive organs: Secondary | ICD-10-CM | POA: Diagnosis present

## 2021-04-28 DIAGNOSIS — Z6841 Body Mass Index (BMI) 40.0 and over, adult: Secondary | ICD-10-CM | POA: Insufficient documentation

## 2021-04-28 DIAGNOSIS — Z8719 Personal history of other diseases of the digestive system: Secondary | ICD-10-CM | POA: Insufficient documentation

## 2021-04-28 DIAGNOSIS — Z1211 Encounter for screening for malignant neoplasm of colon: Secondary | ICD-10-CM | POA: Diagnosis not present

## 2021-04-28 DIAGNOSIS — F419 Anxiety disorder, unspecified: Secondary | ICD-10-CM | POA: Diagnosis not present

## 2021-04-28 DIAGNOSIS — K5730 Diverticulosis of large intestine without perforation or abscess without bleeding: Secondary | ICD-10-CM | POA: Insufficient documentation

## 2021-04-28 HISTORY — PX: COLONOSCOPY: SHX5424

## 2021-04-28 HISTORY — DX: Sleep apnea, unspecified: G47.30

## 2021-04-28 HISTORY — DX: Hypothyroidism, unspecified: E03.9

## 2021-04-28 HISTORY — DX: Gastro-esophageal reflux disease without esophagitis: K21.9

## 2021-04-28 HISTORY — DX: Anxiety disorder, unspecified: F41.9

## 2021-04-28 SURGERY — COLONOSCOPY
Anesthesia: General

## 2021-04-28 MED ORDER — PHENYLEPHRINE 40 MCG/ML (10ML) SYRINGE FOR IV PUSH (FOR BLOOD PRESSURE SUPPORT)
PREFILLED_SYRINGE | INTRAVENOUS | Status: DC | PRN
  Administered 2021-04-28: 80 ug via INTRAVENOUS
  Administered 2021-04-28: 40 ug via INTRAVENOUS
  Administered 2021-04-28: 80 ug via INTRAVENOUS
  Administered 2021-04-28: 40 ug via INTRAVENOUS
  Administered 2021-04-28: 80 ug via INTRAVENOUS

## 2021-04-28 MED ORDER — PROPOFOL 10 MG/ML IV BOLUS
INTRAVENOUS | Status: DC | PRN
  Administered 2021-04-28: 60 mg via INTRAVENOUS

## 2021-04-28 MED ORDER — SODIUM CHLORIDE 0.9 % IV SOLN: INTRAVENOUS | Status: DC

## 2021-04-28 MED ORDER — PHENYLEPHRINE HCL (PRESSORS) 10 MG/ML IV SOLN
INTRAVENOUS | Status: AC
  Filled 2021-04-28: qty 1

## 2021-04-28 MED ORDER — PROPOFOL 500 MG/50ML IV EMUL
INTRAVENOUS | Status: DC | PRN
  Administered 2021-04-28: 140 ug/kg/min via INTRAVENOUS

## 2021-04-28 MED ORDER — LIDOCAINE HCL (CARDIAC) PF 100 MG/5ML IV SOSY
PREFILLED_SYRINGE | INTRAVENOUS | Status: DC | PRN
  Administered 2021-04-28: 100 mg via INTRAVENOUS

## 2021-04-28 MED ORDER — PROPOFOL 500 MG/50ML IV EMUL
INTRAVENOUS | Status: AC
  Filled 2021-04-28: qty 150

## 2021-04-28 NOTE — Anesthesia Procedure Notes (Signed)
Date/Time: 04/28/2021 7:53 AM Performed by: Joanette Gula, Sanjuanita Condrey, CRNA Pre-anesthesia Checklist: Patient identified, Emergency Drugs available, Patient being monitored, Suction available and Timeout performed Patient Re-evaluated:Patient Re-evaluated prior to induction Oxygen Delivery Method: Nasal cannula Induction Type: IV induction

## 2021-04-28 NOTE — Anesthesia Preprocedure Evaluation (Signed)
Anesthesia Evaluation  Patient identified by MRN, date of birth, ID band Patient awake    Reviewed: Allergy & Precautions, NPO status , Patient's Chart, lab work & pertinent test results  History of Anesthesia Complications Negative for: history of anesthetic complications  Airway Mallampati: I  TM Distance: >3 FB Neck ROM: Full    Dental no notable dental hx. (+) Teeth Intact   Pulmonary sleep apnea , neg COPD, Patient abstained from smoking.Not current smoker,    Pulmonary exam normal breath sounds clear to auscultation       Cardiovascular Exercise Tolerance: Good METS(-) hypertension(-) CAD and (-) Past MI negative cardio ROS  (-) dysrhythmias  Rhythm:Regular Rate:Normal - Systolic murmurs    Neuro/Psych PSYCHIATRIC DISORDERS Anxiety negative neurological ROS     GI/Hepatic GERD  ,(+)     (-) substance abuse  ,   Endo/Other  neg diabetesHypothyroidism Morbid obesity  Renal/GU negative Renal ROS     Musculoskeletal   Abdominal (+) + obese,   Peds  Hematology   Anesthesia Other Findings Past Medical History: No date: Anxiety No date: GERD (gastroesophageal reflux disease) No date: Hypothyroidism No date: Sleep apnea  Reproductive/Obstetrics                             Anesthesia Physical Anesthesia Plan  ASA: 3  Anesthesia Plan: General   Post-op Pain Management: Minimal or no pain anticipated   Induction: Intravenous  PONV Risk Score and Plan: 3 and Propofol infusion, TIVA and Ondansetron  Airway Management Planned: Nasal Cannula  Additional Equipment: None  Intra-op Plan:   Post-operative Plan:   Informed Consent: I have reviewed the patients History and Physical, chart, labs and discussed the procedure including the risks, benefits and alternatives for the proposed anesthesia with the patient or authorized representative who has indicated his/her understanding and  acceptance.     Dental advisory given  Plan Discussed with: CRNA and Surgeon  Anesthesia Plan Comments: (Discussed risks of anesthesia with patient, including possibility of difficulty with spontaneous ventilation under anesthesia necessitating airway intervention, PONV, and rare risks such as cardiac or respiratory or neurological events, and allergic reactions. Discussed the role of CRNA in patient's perioperative care. Patient understands. Patient informed about increased incidence of above perioperative risk due to high BMI. Patient understands. )        Anesthesia Quick Evaluation

## 2021-04-28 NOTE — Interval H&P Note (Signed)
History and Physical Interval Note: Preprocedure H&P from 04/28/21  was reviewed and there was no interval change after seeing and examining the patient.  Written consent was obtained from the patient after discussion of risks, benefits, and alternatives. Patient has consented to proceed with Colonoscopy with possible intervention   04/28/2021 7:43 AM  Veronica Harding  has presented today for surgery, with the diagnosis of Family history of colon cancer (Z80.0).  The various methods of treatment have been discussed with the patient and family. After consideration of risks, benefits and other options for treatment, the patient has consented to  Procedure(s) with comments: COLONOSCOPY (N/A) - DM as a surgical intervention.  The patient's history has been reviewed, patient examined, no change in status, stable for surgery.  I have reviewed the patient's chart and labs.  Questions were answered to the patient's satisfaction.     Jaynie Collins

## 2021-04-28 NOTE — Anesthesia Postprocedure Evaluation (Signed)
Anesthesia Post Note  Patient: Veronica Harding  Procedure(s) Performed: COLONOSCOPY  Patient location during evaluation: Endoscopy Anesthesia Type: General Level of consciousness: awake and alert Pain management: pain level controlled Vital Signs Assessment: post-procedure vital signs reviewed and stable Respiratory status: spontaneous breathing, nonlabored ventilation, respiratory function stable and patient connected to nasal cannula oxygen Cardiovascular status: blood pressure returned to baseline and stable Postop Assessment: no apparent nausea or vomiting Anesthetic complications: no   No notable events documented.   Last Vitals:  Vitals:   04/28/21 0821 04/28/21 0831  BP: (!) 95/50 108/62  Pulse:    Resp:    Temp: (!) 36.1 C   SpO2:      Last Pain:  Vitals:   04/28/21 0851  TempSrc:   PainSc: 0-No pain                 Corinda Gubler

## 2021-04-28 NOTE — Transfer of Care (Signed)
Immediate Anesthesia Transfer of Care Note  Patient: Veronica Harding  Procedure(s) Performed: COLONOSCOPY  Patient Location: Endoscopy Unit  Anesthesia Type:General  Level of Consciousness: drowsy  Airway & Oxygen Therapy: Patient Spontanous Breathing  Post-op Assessment: Report given to RN and Post -op Vital signs reviewed and stable  Post vital signs: Reviewed and stable  Last Vitals:  Vitals Value Taken Time  BP 95/50   Temp    Pulse 65   Resp 16   SpO2 97     Last Pain:  Vitals:   04/28/21 0821  TempSrc: Temporal  PainSc:          Complications: No notable events documented.

## 2021-04-28 NOTE — Op Note (Signed)
[facility] Gastroenterology Patient Name: Veronica Harding Procedure Date: 04/28/2021 7:29 AM MRN: [id number] Account #: [id number] Date of Birth: 10/01/1965 Admit Type: Outpatient Age: 56 Room: [facility] ENDO ROOM 2 Gender: Female Note Status: Finalized Instrument Name: Jasper Riling [id number] Procedure:             Colonoscopy Indications:           Screening in patient at increased risk: Family history                         of 1st-degree relative with colorectal cancer Providers:             Annamaria Helling DO, DO Referring MD:          [place] (Referring MD) Medicines:             Monitored Anesthesia Care Complications:         No immediate complications. Estimated blood loss: None. Procedure:             Pre-Anesthesia Assessment:                        - Prior to the procedure, a History and Physical was                         performed, and patient medications and allergies were                         reviewed. The patient is competent. The risks and                         benefits of the procedure and the sedation options and                         risks were discussed with the patient. All questions                         were answered and informed consent was obtained.                         Patient identification and proposed procedure were                         verified by the physician, the nurse, the anesthetist                         and the technician in the endoscopy suite. Mental                         Status Examination: alert and oriented. Airway                         Examination: normal oropharyngeal airway and neck                         mobility. Respiratory Examination: clear to                         auscultation. CV Examination: RRR, no murmurs, no S3  or S4. Prophylactic Antibiotics: The patient does not                         require prophylactic antibiotics. Prior                          Anticoagulants: The patient has taken no previous                         anticoagulant or antiplatelet agents. ASA Grade                         Assessment: III - A patient with severe systemic                         disease. After reviewing the risks and benefits, the                         patient was deemed in satisfactory condition to                         undergo the procedure. The anesthesia plan was to use                         monitored anesthesia care (MAC). Immediately prior to                         administration of medications, the patient was                         re-assessed for adequacy to receive sedatives. The                         heart rate, respiratory rate, oxygen saturations,                         blood pressure, adequacy of pulmonary ventilation, and                         response to care were monitored throughout the                         procedure. The physical status of the patient was                         re-assessed after the procedure.                        After obtaining informed consent, the colonoscope was                         passed under direct vision. Throughout the procedure,                         the patient's blood pressure, pulse, and oxygen                         saturations were monitored continuously. The  Colonoscope was introduced through the anus and                         advanced to the the terminal ileum, with                         identification of the appendiceal orifice and IC                         valve. The colonoscopy was performed without                         difficulty. The patient tolerated the procedure well.                         The quality of the bowel preparation was evaluated                         using the BBPS [facility] Bowel Preparation Scale) with                         scores of: Right Colon = 2 (minor amount of residual                         staining, small  fragments of stool and/or opaque                         liquid, but mucosa seen well), Transverse Colon = 3                         (entire mucosa seen well with no residual staining,                         small fragments of stool or opaque liquid) and Left                         Colon = 3 (entire mucosa seen well with no residual                         staining, small fragments of stool or opaque liquid).                         The total BBPS score equals 8. The quality of the                         bowel preparation was excellent. The terminal ileum,                         ileocecal valve, appendiceal orifice, and rectum were                         photographed. Findings:      The perianal and digital rectal examinations were normal. Pertinent       negatives include normal sphincter tone.      previous hemorrhoidectomy surgery noted, Estimated blood loss: none.      A few small-mouthed diverticula were found in the recto-sigmoid colon  and sigmoid colon. Estimated blood loss: none.      The terminal ileum appeared normal. Estimated blood loss: none.      The exam was otherwise without abnormality on direct and retroflexion       views. Impression:            - Diverticulosis in the recto-sigmoid colon and in the                         sigmoid colon.                        - The examined portion of the ileum was normal.                        - The examination was otherwise normal on direct and                         retroflexion views.                        - No specimens collected. Recommendation:        - Discharge patient to home.                        - Resume previous diet.                        - Continue present medications.                        - Repeat colonoscopy in 5 years for surveillance.                        - Return to referring physician as previously                         scheduled. Procedure Code(s):     --- Professional ---                         [phone], Colonoscopy, flexible; diagnostic, including                         collection of specimen(s) by brushing or washing, when                         performed (separate procedure) Diagnosis Code(s):     --- Professional ---                        Z80.0, Family history of malignant neoplasm of                         digestive organs                        K57.30, Diverticulosis of large intestine without                         perforation or abscess without bleeding CPT copyright 2019 American Medical Association. All rights reserved. The codes documented in this report are preliminary and upon coder  review may  be revised to meet current compliance requirements. Attending Participation:      I personally performed the entire procedure. Volney American, DO Annamaria Helling DO, DO 04/28/2021 8:23:17 AM This report has been signed electronically. Number of Addenda: 0 Note Initiated On: 04/28/2021 7:29 AM Scope Withdrawal Time: 0 hours 19 minutes 8 seconds  Total Procedure Duration: 0 hours 26 minutes 1 second  Estimated Blood Loss:  Estimated blood loss: none.      [facility]

## 2021-04-29 ENCOUNTER — Encounter: Payer: Self-pay | Admitting: Gastroenterology

## 2021-06-07 ENCOUNTER — Other Ambulatory Visit: Payer: Self-pay

## 2021-06-07 ENCOUNTER — Inpatient Hospital Stay (HOSPITAL_COMMUNITY)
Admission: EM | Admit: 2021-06-07 | Discharge: 2021-06-10 | DRG: 069 | Disposition: A | Discharge status: Home / Self Care | Payer: Medicaid Other | Attending: Family Medicine | Admitting: Family Medicine

## 2021-06-07 ENCOUNTER — Encounter (HOSPITAL_COMMUNITY): Payer: Self-pay | Admitting: Emergency Medicine

## 2021-06-07 ENCOUNTER — Emergency Department (HOSPITAL_COMMUNITY): Payer: Medicaid Other

## 2021-06-07 DIAGNOSIS — N189 Chronic kidney disease, unspecified: Secondary | ICD-10-CM | POA: Diagnosis present

## 2021-06-07 DIAGNOSIS — N179 Acute kidney failure, unspecified: Secondary | ICD-10-CM | POA: Diagnosis present

## 2021-06-07 DIAGNOSIS — E1122 Type 2 diabetes mellitus with diabetic chronic kidney disease: Secondary | ICD-10-CM | POA: Diagnosis present

## 2021-06-07 DIAGNOSIS — E860 Dehydration: Secondary | ICD-10-CM | POA: Diagnosis present

## 2021-06-07 DIAGNOSIS — G459 Transient cerebral ischemic attack, unspecified: Principal | ICD-10-CM | POA: Diagnosis present

## 2021-06-07 DIAGNOSIS — G4730 Sleep apnea, unspecified: Secondary | ICD-10-CM | POA: Diagnosis present

## 2021-06-07 DIAGNOSIS — F419 Anxiety disorder, unspecified: Secondary | ICD-10-CM | POA: Diagnosis present

## 2021-06-07 DIAGNOSIS — Z20822 Contact with and (suspected) exposure to covid-19: Secondary | ICD-10-CM | POA: Diagnosis present

## 2021-06-07 DIAGNOSIS — Z888 Allergy status to other drugs, medicaments and biological substances status: Secondary | ICD-10-CM

## 2021-06-07 DIAGNOSIS — Z79899 Other long term (current) drug therapy: Secondary | ICD-10-CM

## 2021-06-07 DIAGNOSIS — I10 Essential (primary) hypertension: Secondary | ICD-10-CM | POA: Diagnosis present

## 2021-06-07 DIAGNOSIS — Z8249 Family history of ischemic heart disease and other diseases of the circulatory system: Secondary | ICD-10-CM

## 2021-06-07 DIAGNOSIS — H5711 Ocular pain, right eye: Secondary | ICD-10-CM | POA: Diagnosis present

## 2021-06-07 DIAGNOSIS — Z7982 Long term (current) use of aspirin: Secondary | ICD-10-CM

## 2021-06-07 DIAGNOSIS — I129 Hypertensive chronic kidney disease with stage 1 through stage 4 chronic kidney disease, or unspecified chronic kidney disease: Secondary | ICD-10-CM | POA: Diagnosis present

## 2021-06-07 DIAGNOSIS — E785 Hyperlipidemia, unspecified: Secondary | ICD-10-CM

## 2021-06-07 DIAGNOSIS — F32A Depression, unspecified: Secondary | ICD-10-CM | POA: Diagnosis present

## 2021-06-07 DIAGNOSIS — E039 Hypothyroidism, unspecified: Secondary | ICD-10-CM | POA: Diagnosis present

## 2021-06-07 DIAGNOSIS — R4781 Slurred speech: Secondary | ICD-10-CM | POA: Diagnosis present

## 2021-06-07 DIAGNOSIS — Z66 Do not resuscitate: Secondary | ICD-10-CM | POA: Diagnosis present

## 2021-06-07 DIAGNOSIS — R2990 Unspecified symptoms and signs involving the nervous system: Secondary | ICD-10-CM

## 2021-06-07 DIAGNOSIS — R202 Paresthesia of skin: Principal | ICD-10-CM

## 2021-06-07 DIAGNOSIS — G4700 Insomnia, unspecified: Secondary | ICD-10-CM | POA: Diagnosis present

## 2021-06-07 DIAGNOSIS — K5900 Constipation, unspecified: Secondary | ICD-10-CM | POA: Diagnosis present

## 2021-06-07 DIAGNOSIS — G4733 Obstructive sleep apnea (adult) (pediatric): Secondary | ICD-10-CM | POA: Diagnosis present

## 2021-06-07 DIAGNOSIS — Z9049 Acquired absence of other specified parts of digestive tract: Secondary | ICD-10-CM

## 2021-06-07 DIAGNOSIS — K219 Gastro-esophageal reflux disease without esophagitis: Secondary | ICD-10-CM | POA: Diagnosis present

## 2021-06-07 DIAGNOSIS — Z6841 Body Mass Index (BMI) 40.0 and over, adult: Secondary | ICD-10-CM

## 2021-06-07 DIAGNOSIS — E66813 Obesity, class 3: Secondary | ICD-10-CM | POA: Diagnosis present

## 2021-06-07 DIAGNOSIS — Z79890 Hormone replacement therapy (postmenopausal): Secondary | ICD-10-CM

## 2021-06-07 DIAGNOSIS — F909 Attention-deficit hyperactivity disorder, unspecified type: Secondary | ICD-10-CM | POA: Diagnosis present

## 2021-06-07 DIAGNOSIS — R200 Anesthesia of skin: Secondary | ICD-10-CM | POA: Diagnosis present

## 2021-06-07 DIAGNOSIS — R29810 Facial weakness: Secondary | ICD-10-CM | POA: Diagnosis present

## 2021-06-07 DIAGNOSIS — Z823 Family history of stroke: Secondary | ICD-10-CM

## 2021-06-07 DIAGNOSIS — R7989 Other specified abnormal findings of blood chemistry: Secondary | ICD-10-CM

## 2021-06-07 HISTORY — DX: Insomnia, unspecified: G47.00

## 2021-06-07 HISTORY — DX: Hyperlipidemia, unspecified: E78.5

## 2021-06-07 HISTORY — DX: Anxiety disorder, unspecified: F41.9

## 2021-06-07 HISTORY — DX: Essential (primary) hypertension: I10

## 2021-06-07 HISTORY — DX: Constipation, unspecified: K59.00

## 2021-06-07 HISTORY — DX: Depression, unspecified: F32.A

## 2021-06-07 LAB — DIFFERENTIAL
Abs Immature Granulocytes: 0.05 10*3/uL (ref 0.00–0.07)
Basophils Absolute: 0.1 10*3/uL (ref 0.0–0.1)
Basophils Relative: 1 %
Eosinophils Absolute: 0.1 10*3/uL (ref 0.0–0.5)
Eosinophils Relative: 2 %
Immature Granulocytes: 1 %
Lymphocytes Relative: 31 %
Lymphs Abs: 3 10*3/uL (ref 0.7–4.0)
Monocytes Absolute: 0.6 10*3/uL (ref 0.1–1.0)
Monocytes Relative: 6 %
Neutro Abs: 5.7 10*3/uL (ref 1.7–7.7)
Neutrophils Relative %: 59 %

## 2021-06-07 LAB — I-STAT CHEM 8, ED
BUN: 27 mg/dL — ABNORMAL HIGH (ref 6–20)
Calcium, Ion: 1.1 mmol/L — ABNORMAL LOW (ref 1.15–1.40)
Chloride: 104 mmol/L (ref 98–111)
Creatinine, Ser: 1.7 mg/dL — ABNORMAL HIGH (ref 0.44–1.00)
Glucose, Bld: 110 mg/dL — ABNORMAL HIGH (ref 70–99)
HCT: 37 % (ref 36.0–46.0)
Hemoglobin: 12.6 g/dL (ref 12.0–15.0)
Potassium: 4.1 mmol/L (ref 3.5–5.1)
Sodium: 137 mmol/L (ref 135–145)
TCO2: 25 mmol/L (ref 22–32)

## 2021-06-07 LAB — CBC
HCT: 37.4 % (ref 36.0–46.0)
Hemoglobin: 11.9 g/dL — ABNORMAL LOW (ref 12.0–15.0)
MCH: 26.9 pg (ref 26.0–34.0)
MCHC: 31.8 g/dL (ref 30.0–36.0)
MCV: 84.6 fL (ref 80.0–100.0)
Platelets: 348 10*3/uL (ref 150–400)
RBC: 4.42 MIL/uL (ref 3.87–5.11)
RDW: 12.7 % (ref 11.5–15.5)
WBC: 9.6 10*3/uL (ref 4.0–10.5)
nRBC: 0 % (ref 0.0–0.2)

## 2021-06-07 LAB — URINALYSIS, ROUTINE W REFLEX MICROSCOPIC
Bilirubin Urine: NEGATIVE
Glucose, UA: NEGATIVE mg/dL
Ketones, ur: NEGATIVE mg/dL
Nitrite: NEGATIVE
Protein, ur: NEGATIVE mg/dL
Specific Gravity, Urine: 1.01 (ref 1.005–1.030)
pH: 5 (ref 5.0–8.0)

## 2021-06-07 LAB — I-STAT BETA HCG BLOOD, ED (MC, WL, AP ONLY): I-stat hCG, quantitative: <5 m[IU]/mL (ref <5)

## 2021-06-07 LAB — PROTIME-INR
INR: 1 (ref 0.8–1.2)
Prothrombin Time: 12.9 seconds (ref 11.4–15.2)

## 2021-06-07 LAB — APTT: aPTT: 28 seconds (ref 24–36)

## 2021-06-07 NOTE — ED Triage Notes (Signed)
Pt c/o left sided facial numbness and left eye drooping that she first noticed when she woke up this morning. Pt also c/o difficulty finding words. Pt A&Ox4, no weakness noted. ?

## 2021-06-07 NOTE — ED Provider Triage Note (Signed)
Emergency Medicine Provider Triage Evaluation Note ? ?Veronica Harding , a 55 y.o. female  was evaluated in triage.  Pt complains of weakness of the left side of her face and having to think more to find her words.  Patient states her last known well was 06/07/21 at about 0100.  She reports mild headache.  No pain in her chest, abdomen, or pelvis.   ? ? ?Physical Exam  ?BP (!) 148/82 (BP Location: Right Arm)   Pulse (!) 105   Temp 98.2 ?F (36.8 ?C) (Oral)   Resp 18   SpO2 99%  ?Gen:   Awake, no distress   ?Resp:  Normal effort  ?MSK:   Moves extremities without difficulty, no pronator drift of weakness of grip strength.   Mild limp with ambulation.  No obvious leg weakness.   ?Other:  Left sided facial droop.  Speech is slow and patient occasionally has a hard time finding a word but is able to speak, answer simple questions, and name objects.   She identifies her sister.  ?Visual fields are intact bilaterally. Full EOMS.  ? ?Medical Decision Making  ?Medically screening exam initiated at 11:07 PM.  Appropriate orders placed.  Veronica Harding was informed that the remainder of the evaluation will be completed by another provider, this initial triage assessment does not replace that evaluation, and the importance of remaining in the ED until their evaluation is complete. ? ?Patient doesn't appear to meet lvo criteria.  I suspect she may have had a stroke but is outside of lytic window.   ?Code stroke type orders placed.  ?  ?Cristina Gong, PA-C ?06/07/21 2314 ? ?

## 2021-06-08 ENCOUNTER — Observation Stay (HOSPITAL_COMMUNITY): Payer: Medicaid Other

## 2021-06-08 ENCOUNTER — Encounter (HOSPITAL_COMMUNITY): Payer: Self-pay | Admitting: Internal Medicine

## 2021-06-08 ENCOUNTER — Observation Stay (HOSPITAL_BASED_OUTPATIENT_CLINIC_OR_DEPARTMENT_OTHER): Payer: Medicaid Other

## 2021-06-08 DIAGNOSIS — F419 Anxiety disorder, unspecified: Secondary | ICD-10-CM | POA: Diagnosis present

## 2021-06-08 DIAGNOSIS — E039 Hypothyroidism, unspecified: Secondary | ICD-10-CM | POA: Diagnosis present

## 2021-06-08 DIAGNOSIS — E66813 Obesity, class 3: Secondary | ICD-10-CM

## 2021-06-08 DIAGNOSIS — G4730 Sleep apnea, unspecified: Secondary | ICD-10-CM | POA: Diagnosis present

## 2021-06-08 DIAGNOSIS — F32A Depression, unspecified: Secondary | ICD-10-CM | POA: Diagnosis present

## 2021-06-08 DIAGNOSIS — I10 Essential (primary) hypertension: Secondary | ICD-10-CM | POA: Diagnosis not present

## 2021-06-08 DIAGNOSIS — R2990 Unspecified symptoms and signs involving the nervous system: Secondary | ICD-10-CM | POA: Diagnosis not present

## 2021-06-08 DIAGNOSIS — Z66 Do not resuscitate: Secondary | ICD-10-CM | POA: Diagnosis present

## 2021-06-08 DIAGNOSIS — G459 Transient cerebral ischemic attack, unspecified: Secondary | ICD-10-CM | POA: Diagnosis not present

## 2021-06-08 HISTORY — DX: Obesity, class 3: E66.813

## 2021-06-08 HISTORY — DX: Morbid (severe) obesity due to excess calories: E66.01

## 2021-06-08 LAB — ETHANOL: Alcohol, Ethyl (B): <10 mg/dL (ref <10)

## 2021-06-08 LAB — TSH: TSH: 2.649 u[IU]/mL (ref 0.350–4.500)

## 2021-06-08 LAB — COMPREHENSIVE METABOLIC PANEL
ALT: 13 U/L (ref 0–44)
AST: 15 U/L (ref 15–41)
Albumin: 3.5 g/dL (ref 3.5–5.0)
Alkaline Phosphatase: 106 U/L (ref 38–126)
Anion gap: 9 (ref 5–15)
BUN: 23 mg/dL — ABNORMAL HIGH (ref 6–20)
CO2: 23 mmol/L (ref 22–32)
Calcium: 9 mg/dL (ref 8.9–10.3)
Chloride: 104 mmol/L (ref 98–111)
Creatinine, Ser: 1.6 mg/dL — ABNORMAL HIGH (ref 0.44–1.00)
GFR, Estimated: 38 mL/min — ABNORMAL LOW (ref >60)
Glucose, Bld: 108 mg/dL — ABNORMAL HIGH (ref 70–99)
Potassium: 4.1 mmol/L (ref 3.5–5.1)
Sodium: 136 mmol/L (ref 135–145)
Total Bilirubin: 0.5 mg/dL (ref 0.3–1.2)
Total Protein: 6.9 g/dL (ref 6.5–8.1)

## 2021-06-08 LAB — RESP PANEL BY RT-PCR (FLU A&B, COVID) ARPGX2
Influenza A by PCR: NEGATIVE
Influenza B by PCR: NEGATIVE
SARS Coronavirus 2 by RT PCR: NEGATIVE

## 2021-06-08 LAB — RAPID URINE DRUG SCREEN, HOSP PERFORMED
Amphetamines: POSITIVE — AB
Barbiturates: NOT DETECTED
Benzodiazepines: NOT DETECTED
Cocaine: NOT DETECTED
Opiates: NOT DETECTED
Tetrahydrocannabinol: NOT DETECTED

## 2021-06-08 LAB — GLUCOSE, CAPILLARY: Glucose-Capillary: 87 mg/dL (ref 70–99)

## 2021-06-08 LAB — CBG MONITORING, ED
Glucose-Capillary: 77 mg/dL (ref 70–99)
Glucose-Capillary: 75 mg/dL (ref 70–99)

## 2021-06-08 MED ORDER — FLUOXETINE HCL 20 MG PO CAPS
20.0000 mg | ORAL_CAPSULE | Freq: Every day | ORAL | Status: DC
  Administered 2021-06-08 – 2021-06-10 (×3): 20 mg via ORAL
  Filled 2021-06-08 (×4): qty 1

## 2021-06-08 MED ORDER — ACETAMINOPHEN 325 MG PO TABS
650.0000 mg | ORAL_TABLET | ORAL | Status: DC | PRN
  Administered 2021-06-09 – 2021-06-10 (×2): 650 mg via ORAL
  Filled 2021-06-08 (×2): qty 2

## 2021-06-08 MED ORDER — ALBUTEROL SULFATE (2.5 MG/3ML) 0.083% IN NEBU: 3.0000 mL | INHALATION_SOLUTION | Freq: Four times a day (QID) | RESPIRATORY_TRACT | Status: DC | PRN

## 2021-06-08 MED ORDER — LACTATED RINGERS IV BOLUS
1000.0000 mL | Freq: Once | INTRAVENOUS | Status: AC
  Administered 2021-06-08: 1000 mL via INTRAVENOUS

## 2021-06-08 MED ORDER — BUPROPION HCL ER (XL) 150 MG PO TB24
150.0000 mg | ORAL_TABLET | Freq: Every day | ORAL | Status: DC
  Administered 2021-06-08 – 2021-06-10 (×3): 150 mg via ORAL
  Filled 2021-06-08 (×3): qty 1

## 2021-06-08 MED ORDER — ACETAMINOPHEN 650 MG RE SUPP: 650.0000 mg | RECTAL | Status: DC | PRN

## 2021-06-08 MED ORDER — LORAZEPAM 2 MG/ML IJ SOLN
1.0000 mg | Freq: Once | INTRAMUSCULAR | Status: AC
  Administered 2021-06-08: 1 mg via INTRAVENOUS
  Filled 2021-06-08: qty 1

## 2021-06-08 MED ORDER — INSULIN ASPART 100 UNIT/ML IJ SOLN: 0.0000 [IU] | Freq: Every day | INTRAMUSCULAR | Status: DC

## 2021-06-08 MED ORDER — STROKE: EARLY STAGES OF RECOVERY BOOK
Freq: Once | Status: AC
  Filled 2021-06-08: qty 1

## 2021-06-08 MED ORDER — ASPIRIN EC 81 MG PO TBEC
81.0000 mg | DELAYED_RELEASE_TABLET | Freq: Every day | ORAL | Status: DC
  Administered 2021-06-08 – 2021-06-10 (×3): 81 mg via ORAL
  Filled 2021-06-08 (×3): qty 1

## 2021-06-08 MED ORDER — PRAVASTATIN SODIUM 40 MG PO TABS
20.0000 mg | ORAL_TABLET | Freq: Every day | ORAL | Status: DC
Start: 2021-06-08 — End: 2021-06-09
  Administered 2021-06-08: 20 mg via ORAL
  Filled 2021-06-08: qty 2

## 2021-06-08 MED ORDER — TRAZODONE HCL 50 MG PO TABS
50.0000 mg | ORAL_TABLET | Freq: Every day | ORAL | Status: DC
  Administered 2021-06-08 – 2021-06-09 (×2): 50 mg via ORAL
  Filled 2021-06-08 (×2): qty 1

## 2021-06-08 MED ORDER — LUBIPROSTONE 8 MCG PO CAPS
8.0000 ug | ORAL_CAPSULE | Freq: Every day | ORAL | Status: DC
  Administered 2021-06-08 – 2021-06-10 (×3): 8 ug via ORAL
  Filled 2021-06-08 (×3): qty 1

## 2021-06-08 MED ORDER — ACETAMINOPHEN 160 MG/5ML PO SOLN: 650.0000 mg | ORAL | Status: DC | PRN

## 2021-06-08 MED ORDER — INSULIN ASPART 100 UNIT/ML IJ SOLN: 0.0000 [IU] | Freq: Three times a day (TID) | INTRAMUSCULAR | Status: DC

## 2021-06-08 MED ORDER — SENNOSIDES-DOCUSATE SODIUM 8.6-50 MG PO TABS: 1.0000 | ORAL_TABLET | Freq: Every evening | ORAL | Status: DC | PRN

## 2021-06-08 MED ORDER — SODIUM CHLORIDE 0.9 % IV SOLN: INTRAVENOUS | Status: DC

## 2021-06-08 MED ORDER — ENOXAPARIN SODIUM 40 MG/0.4ML IJ SOSY
40.0000 mg | PREFILLED_SYRINGE | INTRAMUSCULAR | Status: DC
  Administered 2021-06-08 – 2021-06-10 (×3): 40 mg via SUBCUTANEOUS
  Filled 2021-06-08 (×3): qty 0.4

## 2021-06-08 MED ORDER — LEVOTHYROXINE SODIUM 50 MCG PO TABS
50.0000 ug | ORAL_TABLET | Freq: Every day | ORAL | Status: DC
  Administered 2021-06-08 – 2021-06-10 (×3): 50 ug via ORAL
  Filled 2021-06-08 (×3): qty 1

## 2021-06-08 NOTE — ED Notes (Signed)
Admit MD at bedside

## 2021-06-08 NOTE — Assessment & Plan Note (Signed)
-  Prior BMI was 46.1; current weight is requested ?-Weight loss should be encouraged ?-Outpatient PCP/bariatric medicine/bariatric surgery f/u encouraged ?

## 2021-06-08 NOTE — ED Notes (Signed)
Pt to US.

## 2021-06-08 NOTE — ED Notes (Addendum)
Secretary requested Transport to room ?

## 2021-06-08 NOTE — H&P (Signed)
?History and Physical  ? ? ?Patient: Veronica Harding [id number] DOB: Jul 21, 1965 ?DOA: 06/07/2021 ?DOS: the patient was seen and examined on 06/08/2021 ?PCP: [place]  ?Patient coming from: Home - lives with son and his girlfriend; NOK: Melonie [place], [phone] ? ? ?Chief Complaint: L facial numbness and droop ? ?HPI: Veronica Harding is a 56 y.o. female with medical history significant of hypothyroidism; anxiety; and OSA presenting with L facial numbness and droop.   She reports that Saturday AM she awoke after a short night of sleep.  She looked in the mirror and her left eye looked different.  As the day progressed, the L face started tingling and the left eye started to feel different.  Symptoms worsened during the day with regards to thinking and talking at the same time. She was off balance and slurred her speech some.  Symptoms cleared in the waiting room but she has had a headache across the back of her head and also with R eye pain.  She continues to have a mild headache and her left eye is still different from usual.  The left eye is still "not as open" as usual.  No dysphagia but her mouth has been very dry. ? ? ? ?ER Course:  Carryover, per Dr. Sidney Ace: ? ?56 year old female with H/o ADD, depression, DM 2, dyslipidemia and hypothyroidism, coming with mild dehydration and AKI and facial droop with slurred speech concerning for TIA.  Brain MRI is pending.  Dr. Cheral Marker was notified and recommended official consult after her MRI results. ? ? ? ? ?Review of Systems: As mentioned in the history of present illness. All other systems reviewed and are negative. ?Past Medical History:  ?Diagnosis Date  ? Anxiety and depression   ? Constipation   ? Dyslipidemia   ? GERD (gastroesophageal reflux disease)   ? Hypertension   ? Hypothyroidism   ? Insomnia   ? Obesity, Class III, BMI 40-49.9 (morbid obesity) ([place]) 06/08/2021  ? Sleep apnea   ? does not wear CPAP  ? ?Past Surgical History:  ?Procedure Laterality  Date  ? CHOLECYSTECTOMY    ? COLONOSCOPY N/A 04/28/2021  ? Procedure: COLONOSCOPY;  Surgeon: Annamaria Helling, DO;  Location: [facility] ENDOSCOPY;  Service: Gastroenterology;  Laterality: N/A;  DM  ? ?Social History:  reports that she has never smoked. She has never used smokeless tobacco. She reports that she does not drink alcohol and does not use drugs. ? ?Allergies  ?Allergen Reactions  ? Lactase-Lactobacillus   ? ? ?Family History  ?Problem Relation Age of Onset  ? Stroke Mother 41  ? ? ?Prior to Admission medications   ?Medication Sig Start Date End Date Taking? Authorizing Provider  ?albuterol (VENTOLIN HFA) 108 (90 Base) MCG/ACT inhaler Inhale 2 puffs into the lungs every 6 (six) hours as needed for wheezing or shortness of breath. 2 puffs every 4 to 6 hours as needed   Yes [provider]  ?AMITIZA 8 MCG capsule Take 8 mcg by mouth 2 (two) times daily. 05/29/21  Yes [provider]  ?amphetamine-dextroamphetamine (ADDERALL) 5 MG tablet Take 5 mg by mouth 2 (two) times daily with a meal.   Yes [provider]  ?aspirin EC 81 MG tablet Take 81 mg by mouth daily. Swallow whole.   Yes [provider]  ?buPROPion (WELLBUTRIN XL) 150 MG 24 hr tablet Take 150 mg by mouth daily.   Yes [provider]  ?ergocalciferol (VITAMIN D2) 1.25 MG (50000 UT)  capsule Take 50,000 Units by mouth once a week.   Yes [provider]  ?FLUoxetine (PROZAC) 20 MG tablet Take 20 mg by mouth daily.   Yes [provider]  ?Indacaterol-Glycopyrrolate (UTIBRON NEOHALER) 27.5-15.6 MCG CAPS Place 1 capsule into inhaler and inhale 2 (two) times daily as needed.   Yes [provider]  ?levothyroxine (SYNTHROID) 50 MCG tablet Take 50 mcg by mouth daily.   Yes [provider]  ?lisinopril (ZESTRIL) 20 MG tablet Take 20 mg by mouth daily.   Yes [provider]  ?pravastatin (PRAVACHOL) 20 MG tablet Take 20 mg by mouth daily.   Yes [provider]   ?traZODone (DESYREL) 50 MG tablet Take 50 mg by mouth at bedtime.   Yes [provider]  ?desvenlafaxine (PRISTIQ) 50 MG 24 hr tablet Take 50 mg by mouth daily. ?Patient not taking: Reported on 06/08/2021    [provider]  ?HYDROcodone-acetaminophen (NORCO/VICODIN) 5-325 MG tablet Take 1 tablet by mouth every 4 (four) hours as needed for moderate pain. ?Patient not taking: Reported on 06/08/2021 07/24/15   Johnn Hai, PA-C  ?meloxicam (MOBIC) 7.5 MG tablet Take 7.5 mg by mouth 2 (two) times daily as needed for pain. ?Patient not taking: Reported on 06/08/2021    [provider]  ?metFORMIN (GLUCOPHAGE) 500 MG tablet Take 500 mg by mouth 2 (two) times daily with a meal. ?Patient not taking: Reported on 06/08/2021    [provider]  ?methocarbamol (ROBAXIN) 750 MG tablet Take 750 mg by mouth 2 (two) times daily as needed. ?Patient not taking: Reported on 06/08/2021    [provider]  ?sulfamethoxazole-trimethoprim (BACTRIM DS,SEPTRA DS) 800-160 MG tablet Take 1 tablet by mouth 2 (two) times daily. ?Patient not taking: Reported on 04/28/2021 07/24/15   Johnn Hai, PA-C  ? ? ?Physical Exam: ?Vitals:  ? 06/08/21 0739 06/08/21 0915 06/08/21 1206 06/08/21 1500  ?BP: 119/61  96/66 116/64  ?Pulse: 70 76 82 78  ?Resp: 16 18 (!) 21 15  ?Temp:      ?TempSrc:      ?SpO2: 99% 94% 100% 97%  ? ?General:  Appears calm and comfortable and is in NAD ?Eyes:  PERRL, EOMI, normal lids, iris ?ENT:  grossly normal hearing, lips & tongue, mmm ?Neck:  no LAD, masses or thyromegaly ?Cardiovascular:  RRR, no m/r/g. No LE edema.  ?Respiratory:   CTA bilaterally with no wheezes/rales/rhonchi.  Normal respiratory effort. ?Abdomen:  soft, NT, ND ?Skin:  no rash or induration seen on limited exam ?Musculoskeletal:  grossly normal tone BUE/BLE, good ROM, no bony abnormality ?Psychiatric:  grossly normal mood and affect, speech fluent and appropriate, AOx3 ?Neurologic:  CN 2-12 grossly intact  with ?subtle left facial droop, moves all extremities in coordinated fashion, sensation diminished in left V1-2 distribution ? ? ?Radiological Exams on Admission: ?Independently reviewed - see discussion in A/P where applicable ? ?CT HEAD WO CONTRAST ? ?Result Date: 06/07/2021 ?CLINICAL DATA:  Headache and dizziness. EXAM: CT HEAD WITHOUT CONTRAST TECHNIQUE: Contiguous axial images were obtained from the base of the skull through the vertex without intravenous contrast. RADIATION DOSE REDUCTION: This exam was performed according to the departmental dose-optimization program which includes automated exposure control, adjustment of the mA and/or kV according to patient size and/or use of iterative reconstruction technique. COMPARISON:  None. FINDINGS: Brain: No evidence of acute infarction, hemorrhage, hydrocephalus, extra-axial collection or mass lesion/mass effect. Vascular: No hyperdense vessel or unexpected calcification. Skull: Normal. Negative for fracture  or focal lesion. Sinuses/Orbits: No acute finding. Other: None. IMPRESSION: No acute intracranial pathology. Electronically Signed   By: Virgina Norfolk M.D.   On: 06/07/2021 23:24  ? ?MR ANGIO HEAD WO CONTRAST ? ?Result Date: 06/08/2021 ?CLINICAL DATA:  Left face weakness and trouble finding words. Headache EXAM: MRI HEAD WITHOUT CONTRAST MRA HEAD WITHOUT CONTRAST TECHNIQUE: Multiplanar, multi-echo pulse sequences of the brain and surrounding structures were acquired without intravenous contrast. Angiographic images of the Circle of Willis were acquired using MRA technique without intravenous contrast. COMPARISON:  Head CT from yesterday FINDINGS: MRI HEAD FINDINGS Brain: No acute infarction, hemorrhage, hydrocephalus, extra-axial collection or mass lesion. Small FLAIR hyperintensities in the cerebral white matter without specific pattern including periventricular/infratentorial demyelinating type pattern. Vascular: Normal flow voids. Skull and upper cervical  spine: Normal marrow signal. Sinuses/Orbits: No acute or significant finding. MRA HEAD FINDINGS Anterior circulation: Vessels are smooth and widely patent. Left MCA branch infundibulum projecting inferiorl

## 2021-06-08 NOTE — ED Notes (Signed)
To MRI

## 2021-06-08 NOTE — ED Notes (Signed)
Pt ambulatory to restroom without assistance 

## 2021-06-08 NOTE — Assessment & Plan Note (Signed)
-  Normal TSH ?-Continue Synthroid at current dose for now ?

## 2021-06-08 NOTE — Progress Notes (Signed)
PT Cancellation Note ? ?Patient Details ?Name: Veronica Harding ?MRN: [id number] ?DOB: 09-Jun-1965 ? ? ?Cancelled Treatment:    Reason Eval/Treat Not Completed: PT screened, no needs identified, will sign off. Pt ambulating to bathroom independently in ED, denies any acute changes to mobility or gait. Pt declines PT evaluation at this time. Acute PT signing off. ? ? ?Zenaida Niece ?06/08/2021, 4:08 PM ?

## 2021-06-08 NOTE — Assessment & Plan Note (Signed)
-  I have discussed code status with the patient.  The patient would not desire resuscitation and would prefer to die a natural death should that situation arise. ?

## 2021-06-08 NOTE — ED Provider Notes (Signed)
?[facility] EMERGENCY DEPARTMENT ?Provider Note ? ? ?CSN: [id number] ?Arrival date & time: 06/07/21  2250 ? ?  ? ?History ? ?Chief Complaint  ?Patient presents with  ? Numbness  ? ? ?Veronica Harding is a 56 y.o. female. ? ?Left facial numbness with eye droop. Reportedly was worse earlier and associated with slurred speech, headache and confusion. No trauma. No recent illnesses. Strong family history of cva/cad but no personal history of same. When I asked about her abnormal kidney function, she states that she has never been told that before and only records in the s ystem are from 2013. I ask if she is dehydrated and she thinks that is highly unlikely as she drinks fluids often but is also thirsty quite a bit.  ? ? ? ?  ? ?Home Medications ?Prior to Admission medications   ?Medication Sig Start Date End Date Taking? Authorizing Provider  ?albuterol (VENTOLIN HFA) 108 (90 Base) MCG/ACT inhaler Inhale 2 puffs into the lungs every 6 (six) hours as needed for wheezing or shortness of breath. 2 puffs every 4 to 6 hours as needed   Yes [provider]  ?AMITIZA 8 MCG capsule Take 8 mcg by mouth 2 (two) times daily. 05/29/21  Yes [provider]  ?amphetamine-dextroamphetamine (ADDERALL) 5 MG tablet Take 5 mg by mouth 2 (two) times daily with a meal.   Yes [provider]  ?aspirin EC 81 MG tablet Take 81 mg by mouth daily. Swallow whole.   Yes [provider]  ?buPROPion (WELLBUTRIN XL) 150 MG 24 hr tablet Take 150 mg by mouth daily.   Yes [provider]  ?ergocalciferol (VITAMIN D2) 1.25 MG (50000 UT) capsule Take 50,000 Units by mouth once a week.   Yes [provider]  ?FLUoxetine (PROZAC) 20 MG tablet Take 20 mg by mouth daily.   Yes [provider]  ?Indacaterol-Glycopyrrolate (UTIBRON NEOHALER) 27.5-15.6 MCG CAPS Place 1 capsule into inhaler and inhale 2 (two) times daily as needed.   Yes [provider]  ?levothyroxine  (SYNTHROID) 50 MCG tablet Take 50 mcg by mouth daily.   Yes [provider]  ?lisinopril (ZESTRIL) 20 MG tablet Take 20 mg by mouth daily.   Yes [provider]  ?pravastatin (PRAVACHOL) 20 MG tablet Take 20 mg by mouth daily.   Yes [provider]  ?traZODone (DESYREL) 50 MG tablet Take 50 mg by mouth at bedtime.   Yes [provider]  ?desvenlafaxine (PRISTIQ) 50 MG 24 hr tablet Take 50 mg by mouth daily. ?Patient not taking: Reported on 06/08/2021    [provider]  ?HYDROcodone-acetaminophen (NORCO/VICODIN) 5-325 MG tablet Take 1 tablet by mouth every 4 (four) hours as needed for moderate pain. ?Patient not taking: Reported on 06/08/2021 07/24/15   Tommi Rumps, PA-C  ?meloxicam (MOBIC) 7.5 MG tablet Take 7.5 mg by mouth 2 (two) times daily as needed for pain. ?Patient not taking: Reported on 06/08/2021    [provider]  ?metFORMIN (GLUCOPHAGE) 500 MG tablet Take 500 mg by mouth 2 (two) times daily with a meal. ?Patient not taking: Reported on 06/08/2021    [provider]  ?methocarbamol (ROBAXIN) 750 MG tablet Take 750 mg by mouth 2 (two) times daily as needed. ?Patient not taking: Reported on 06/08/2021    [provider]  ?sulfamethoxazole-trimethoprim (BACTRIM DS,SEPTRA DS) 800-160 MG tablet Take 1 tablet by mouth 2 (two) times daily. ?Patient not taking: Reported on 04/28/2021 07/24/15  Tommi Rumps, PA-C  ?   ? ?Allergies    ?Lactase-lactobacillus   ? ?Review of Systems   ?Review of Systems ? ?Physical Exam ?Updated Vital Signs ?BP (!) 117/56   Pulse 70   Temp 98.2 ?F (36.8 ?C) (Oral)   Resp 19   SpO2 100%  ?Physical Exam ?Vitals and nursing note reviewed.  ?HENT:  ?   Head: Normocephalic and atraumatic.  ?   Mouth/Throat:  ?   Mouth: Mucous membranes are dry.  ?Eyes:  ?   General:     ?   Right eye: No discharge.     ?   Left eye: No discharge.  ?   Extraocular Movements: Extraocular movements intact.  ?   Pupils: Pupils  are equal, round, and reactive to light.  ?Abdominal:  ?   General: Abdomen is flat.  ?Musculoskeletal:     ?   General: No swelling or tenderness. Normal range of motion.  ?Skin: ?   General: Skin is warm and dry.  ?   Coloration: Skin is not jaundiced or pale.  ?Neurological:  ?   Mental Status: She is alert.  ?   Cranial Nerves: No cranial nerve deficit.  ?   Sensory: No sensory deficit.  ?   Motor: No weakness.  ?   Comments: Left eyelid droop ?Mild decreased l nasolabial fold but improves with smiling  ? ? ?ED Results / Procedures / Treatments   ?Labs ?(all labs ordered are listed, but only abnormal results are displayed) ?Labs Reviewed  ?CBC - Abnormal; Notable for the following components:  ?    Result Value  ? Hemoglobin 11.9 (*)   ? All other components within normal limits  ?COMPREHENSIVE METABOLIC PANEL - Abnormal; Notable for the following components:  ? Glucose, Bld 108 (*)   ? BUN 23 (*)   ? Creatinine, Ser 1.60 (*)   ? GFR, Estimated 38 (*)   ? All other components within normal limits  ?RAPID URINE DRUG SCREEN, HOSP PERFORMED - Abnormal; Notable for the following components:  ? Amphetamines POSITIVE (*)   ? All other components within normal limits  ?URINALYSIS, ROUTINE W REFLEX MICROSCOPIC - Abnormal; Notable for the following components:  ? APPearance HAZY (*)   ? Hgb urine dipstick MODERATE (*)   ? Leukocytes,Ua TRACE (*)   ? Bacteria, UA RARE (*)   ? All other components within normal limits  ?I-STAT CHEM 8, ED - Abnormal; Notable for the following components:  ? BUN 27 (*)   ? Creatinine, Ser 1.70 (*)   ? Glucose, Bld 110 (*)   ? Calcium, Ion 1.10 (*)   ? All other components within normal limits  ?RESP PANEL BY RT-PCR (FLU A&B, COVID) ARPGX2  ?ETHANOL  ?PROTIME-INR  ?APTT  ?DIFFERENTIAL  ?TSH  ?I-STAT BETA HCG BLOOD, ED ([facility], [facility], [facility] ONLY)  ? ? ?EKG ?None ? ?Radiology ?CT HEAD WO CONTRAST ? ?Result Date: 06/07/2021 ?CLINICAL DATA:  Headache and dizziness. EXAM: CT HEAD WITHOUT CONTRAST TECHNIQUE:  Contiguous axial images were obtained from the base of the skull through the vertex without intravenous contrast. RADIATION DOSE REDUCTION: This exam was performed according to the departmental dose-optimization program which includes automated exposure control, adjustment of the mA and/or kV according to patient size and/or use of iterative reconstruction technique. COMPARISON:  None. FINDINGS: Brain: No evidence of acute infarction, hemorrhage, hydrocephalus, extra-axial collection or mass lesion/mass effect. Vascular: No hyperdense vessel or unexpected calcification. Skull: Normal.  Negative for fracture or focal lesion. Sinuses/Orbits: No acute finding. Other: None. IMPRESSION: No acute intracranial pathology. Electronically Signed   By: Aram Candelahaddeus  Houston M.D.   On: 06/07/2021 23:24   ? ?Procedures ?Procedures  ? ?Medications Ordered in ED ?Medications  ?lactated ringers bolus 1,000 mL (has no administration in time range)  ? ? ?ED Course/ Medical Decision Making/ A&P ?  ?                        ?Medical Decision Making ?Amount and/or Complexity of Data Reviewed ?Labs: ordered. ? ? ?Mild dehydration which will give some fluids for.  ? ?Also with concern for likely TIA as she had this headache and improving facial droop and slurred speech.  ? ?[facility] requests consult with neurologist.  ? ?Spoke with Dr. Otelia LimesLindzen, reviewed the case. states he would want formal consult if MRI positive but ddx is very large at this point. He did note multiple psychoactive medications that could be contributing in the setting of dehydration as a possibility. Information relayed to Dr. Arville CareMansy.  ? ?Final Clinical Impression(s) / ED Diagnoses ?Final diagnoses:  ?None  ? ? ?Rx / DC Orders ?ED Discharge Orders   ? ? None  ? ?  ? ? ?  ?Marily MemosMesner, Ginnette Gates, MD ?06/08/21 [phone] ? ?

## 2021-06-08 NOTE — Assessment & Plan Note (Deleted)
-  Patient presenting with left-sided facial droop with some other neurologic symptoms that have mostly resolved ?-Concerning for TIA/CVA ?-Aspirin has been given to reduce stroke mortality and decrease morbidity ?-Will place in observation status for CVA/TIA evaluation ?-Telemetry monitoring ?-MRI/MRA ?-Carotid dopplers; if ipsilateral carotid stenosis is detected then prompt vascular surgery consultation is needed for consideration of CEA. ?-Echo ?-Risk stratification with FLP, A1c; will also check TSH and UDS ?-Neurology consult ?-PT/OT/ST/Nutrition Consults ? ?HTN ?-Allow permissive HTN for now ?-Treat BP only if >220/120, and then with goal of 15% reduction ?-Hold Lisinopril and plan to restart in 48-72 hours ?  ?HLD ?-Check FLP ?-Continue Pravachol for now ?

## 2021-06-08 NOTE — Assessment & Plan Note (Addendum)
-  Continue Bupropion, fluoxetine, trazodone ?- resume adhd meds ?

## 2021-06-09 ENCOUNTER — Observation Stay (HOSPITAL_COMMUNITY): Payer: Medicaid Other

## 2021-06-09 DIAGNOSIS — N179 Acute kidney failure, unspecified: Secondary | ICD-10-CM | POA: Diagnosis not present

## 2021-06-09 DIAGNOSIS — I10 Essential (primary) hypertension: Secondary | ICD-10-CM | POA: Diagnosis not present

## 2021-06-09 DIAGNOSIS — R2990 Unspecified symptoms and signs involving the nervous system: Secondary | ICD-10-CM | POA: Diagnosis not present

## 2021-06-09 DIAGNOSIS — E039 Hypothyroidism, unspecified: Secondary | ICD-10-CM | POA: Diagnosis not present

## 2021-06-09 DIAGNOSIS — G4730 Sleep apnea, unspecified: Secondary | ICD-10-CM | POA: Diagnosis not present

## 2021-06-09 DIAGNOSIS — E785 Hyperlipidemia, unspecified: Secondary | ICD-10-CM

## 2021-06-09 DIAGNOSIS — H02402 Unspecified ptosis of left eyelid: Secondary | ICD-10-CM

## 2021-06-09 DIAGNOSIS — G459 Transient cerebral ischemic attack, unspecified: Secondary | ICD-10-CM | POA: Diagnosis not present

## 2021-06-09 DIAGNOSIS — R7989 Other specified abnormal findings of blood chemistry: Secondary | ICD-10-CM

## 2021-06-09 LAB — HEMOGLOBIN A1C
Hgb A1c MFr Bld: 5.5 % (ref 4.8–5.6)
Mean Plasma Glucose: 111.15 mg/dL

## 2021-06-09 LAB — LIPID PANEL
Cholesterol: 183 mg/dL (ref 0–200)
HDL: 39 mg/dL — ABNORMAL LOW (ref >40)
LDL Cholesterol: 108 mg/dL — ABNORMAL HIGH (ref 0–99)
Total CHOL/HDL Ratio: 4.7 RATIO
Triglycerides: 179 mg/dL — ABNORMAL HIGH (ref <150)
VLDL: 36 mg/dL (ref 0–40)

## 2021-06-09 LAB — BASIC METABOLIC PANEL
Anion gap: 7 (ref 5–15)
BUN: 22 mg/dL — ABNORMAL HIGH (ref 6–20)
CO2: 24 mmol/L (ref 22–32)
Calcium: 8.7 mg/dL — ABNORMAL LOW (ref 8.9–10.3)
Chloride: 106 mmol/L (ref 98–111)
Creatinine, Ser: 1.52 mg/dL — ABNORMAL HIGH (ref 0.44–1.00)
GFR, Estimated: 41 mL/min — ABNORMAL LOW (ref >60)
Glucose, Bld: 84 mg/dL (ref 70–99)
Potassium: 4.2 mmol/L (ref 3.5–5.1)
Sodium: 137 mmol/L (ref 135–145)

## 2021-06-09 LAB — ECHOCARDIOGRAM COMPLETE
Area-P 1/2: 3.72 cm2
Calc EF: 54.3 %
S' Lateral: 2.7 cm
Single Plane A2C EF: 53.2 %
Single Plane A4C EF: 56.2 %

## 2021-06-09 LAB — GLUCOSE, CAPILLARY
Glucose-Capillary: 80 mg/dL (ref 70–99)
Glucose-Capillary: 94 mg/dL (ref 70–99)
Glucose-Capillary: 95 mg/dL (ref 70–99)
Glucose-Capillary: 103 mg/dL — ABNORMAL HIGH (ref 70–99)

## 2021-06-09 MED ORDER — PERFLUTREN LIPID MICROSPHERE
1.0000 mL | INTRAVENOUS | Status: AC | PRN
  Administered 2021-06-09: 2 mL via INTRAVENOUS
  Filled 2021-06-09: qty 10

## 2021-06-09 MED ORDER — GADOBUTROL 1 MMOL/ML IV SOLN
10.0000 mL | Freq: Once | INTRAVENOUS | Status: AC | PRN
  Administered 2021-06-09: 10 mL via INTRAVENOUS

## 2021-06-09 MED ORDER — ATORVASTATIN CALCIUM 40 MG PO TABS
40.0000 mg | ORAL_TABLET | Freq: Every day | ORAL | Status: DC
  Administered 2021-06-10: 40 mg via ORAL
  Filled 2021-06-09: qty 1

## 2021-06-09 NOTE — Consult Note (Signed)
NEUROLOGY CONSULTATION NOTE  ? ?Date of service: June 09, 2021 ?Patient Name: Veronica MonacoLaura M Harding ?MRN:  [id number] ?DOB:  May 11, 1965 ?Reason for consult: L facial droop w/ ptosis ?Requesting physician: Dr. Lacretia Nicksaldwell Powell ?_ _ _   _ __   _ __ _ _  __ __   _ __   __ _ ? ?History of Present Illness  ? ? ?This is a 56 year old woman with a past medical history significant for hypothyroidism, anxiety, OSA who presented with left facial numbness and droop.  Symptoms started Saturday morning when she woke up from sleep.  She looked in the mirror and noted that her left eye seemed droopy.  Her left face also had some slight tingling that day.  She began to feel off balance (more than usual, states she is pretty incoordinated at baseline).  She also felt some brain fog and was told by one of her family members that she was slightly slurring her speech.  She did have a significant headache at that time and does have a history of migraines.  Her headache has improved since admission but her left ptosis has continued.  MRI brain showed no evidence of acute infarct (personal review).  MRA head unremarkable. Patient has no other neurologic deficits at this time aside from left ptosis and slight left lower facial droop.  Her forehead is spared. ?  ?ROS  ? ?Per HPI: all other systems reviewed and are negative ? ?Past History  ? ?I have reviewed the following: ? ?Past Medical History:  ?Diagnosis Date  ? Anxiety and depression   ? Constipation   ? Dyslipidemia   ? GERD (gastroesophageal reflux disease)   ? Hypertension   ? Hypothyroidism   ? Insomnia   ? Obesity, Class III, BMI 40-49.9 (morbid obesity) (HCC) 06/08/2021  ? Sleep apnea   ? does not wear CPAP  ? ?Past Surgical History:  ?Procedure Laterality Date  ? CHOLECYSTECTOMY    ? COLONOSCOPY N/A 04/28/2021  ? Procedure: COLONOSCOPY;  Surgeon: Jaynie Collinsusso, Steven Michael, DO;  [facility];  Service: Gastroenterology;  Laterality: N/A;  DM  ? ?Family History  ?Problem Relation  Age of Onset  ? Stroke Mother [id number]  ? ?Social History  ? ?Socioeconomic History  ? Marital status: Divorced  ?  Spouse name: Not on file  ? Number of children: Not on file  ? Years of education: Not on file  ? Highest education level: Not on file  ?Occupational History  ? Occupation: Set designermanufacturing  ?  Comment: [employer]  ?Tobacco Use  ? Smoking status: Never  ? Smokeless tobacco: Never  ?Vaping Use  ? Vaping Use: Never used  ?Substance and Sexual Activity  ? Alcohol use: Never  ? Drug use: Never  ? Sexual activity: Not on file  ?Other Topics Concern  ? Not on file  ?Social History Narrative  ? Not on file  ? ?Social Determinants of Health  ? ?Financial Resource Strain: Not on file  ?Food Insecurity: Not on file  ?Transportation Needs: Not on file  ?Physical Activity: Not on file  ?Stress: Not on file  ?Social Connections: Not on file  ? ?Allergies  ?Allergen Reactions  ? Lactase-Lactobacillus   ? ? ?Medications  ? ?Medications Prior to Admission  ?Medication Sig Dispense Refill Last Dose  ? albuterol (VENTOLIN HFA) 108 (90 Base) MCG/ACT inhaler Inhale 2 puffs into the lungs every 6 (six) hours as needed for wheezing or shortness of breath. 2 puffs every 4  to 6 hours as needed   Past Month  ? AMITIZA 8 MCG capsule Take 8 mcg by mouth 2 (two) times daily.   06/07/2021  ? amphetamine-dextroamphetamine (ADDERALL) 5 MG tablet Take 5 mg by mouth 2 (two) times daily with a meal.   06/07/2021  ? aspirin EC 81 MG tablet Take 81 mg by mouth daily. Swallow whole.   Past Month  ? buPROPion (WELLBUTRIN XL) 150 MG 24 hr tablet Take 150 mg by mouth daily.   06/07/2021  ? ergocalciferol (VITAMIN D2) 1.25 MG (50000 UT) capsule Take 50,000 Units by mouth once a week.   Past Week  ? FLUoxetine (PROZAC) 20 MG tablet Take 20 mg by mouth daily.   06/07/2021  ? Indacaterol-Glycopyrrolate (UTIBRON NEOHALER) 27.5-15.6 MCG CAPS Place 1 capsule into inhaler and inhale 2 (two) times daily as needed.   Past Month  ? levothyroxine (SYNTHROID)  50 MCG tablet Take 50 mcg by mouth daily.   06/07/2021  ? lisinopril (ZESTRIL) 20 MG tablet Take 20 mg by mouth daily.   06/07/2021  ? pravastatin (PRAVACHOL) 20 MG tablet Take 20 mg by mouth daily.   06/07/2021  ? traZODone (DESYREL) 50 MG tablet Take 50 mg by mouth at bedtime.   06/07/2021  ?  ? ? ?Current Facility-Administered Medications:  ?  0.9 %  sodium chloride infusion, , Intravenous, Continuous, Jonah Blue, MD, Last Rate: 50 mL/hr at 06/09/21 0802, New Bag at 06/09/21 0802 ?  acetaminophen (TYLENOL) tablet 650 mg, 650 mg, Oral, Q4H PRN, 650 mg at 06/09/21 1236 **OR** acetaminophen (TYLENOL) 160 MG/5ML solution 650 mg, 650 mg, Per Tube, Q4H PRN **OR** acetaminophen (TYLENOL) suppository 650 mg, 650 mg, Rectal, Q4H PRN, Jonah Blue, MD ?  albuterol (PROVENTIL) (2.5 MG/3ML) 0.083% nebulizer solution 3 mL, 3 mL, Inhalation, Q6H PRN, Jonah Blue, MD ?  aspirin EC tablet 81 mg, 81 mg, Oral, Daily, Jonah Blue, MD, 81 mg at 06/09/21 1001 ?  [START ON 06/10/2021] atorvastatin (LIPITOR) tablet 40 mg, 40 mg, Oral, Daily, Jefferson Fuel, MD ?  buPROPion (WELLBUTRIN XL) 24 hr tablet 150 mg, 150 mg, Oral, Daily, Jonah Blue, MD, 150 mg at 06/09/21 1001 ?  enoxaparin (LOVENOX) injection 40 mg, 40 mg, Subcutaneous, Q24H, Jonah Blue, MD, 40 mg at 06/09/21 1002 ?  FLUoxetine (PROZAC) capsule 20 mg, 20 mg, Oral, Daily, Jonah Blue, MD, 20 mg at 06/09/21 1001 ?  insulin aspart (novoLOG) injection 0-15 Units, 0-15 Units, Subcutaneous, TID WC, Jonah Blue, MD ?  insulin aspart (novoLOG) injection 0-5 Units, 0-5 Units, Subcutaneous, QHS, Jonah Blue, MD ?  levothyroxine (SYNTHROID) tablet 50 mcg, 50 mcg, Oral, Daily, Jonah Blue, MD, 50 mcg at 06/09/21 0981 ?  lubiprostone (AMITIZA) capsule 8 mcg, 8 mcg, Oral, Q breakfast, Jonah Blue, MD, 8 mcg at 06/09/21 1914 ?  senna-docusate (Senokot-S) tablet 1 tablet, 1 tablet, Oral, QHS PRN, Jonah Blue, MD ?  traZODone (DESYREL) tablet 50  mg, 50 mg, Oral, QHS, Jonah Blue, MD, 50 mg at 06/08/21 2343 ? ?Vitals  ? ?Vitals:  ? 06/09/21 0751 06/09/21 0800 06/09/21 1134 06/09/21 1649  ?BP: 95/60  109/72 116/73  ?Pulse: 68  78 71  ?Resp: 20 16 20 14   ?Temp: 97.9 ?F (36.6 ?C)  98.1 ?F (36.7 ?C) 98 ?F (36.7 ?C)  ?TempSrc: Oral  Oral Oral  ?SpO2: 99%  97% 100%  ?  ? ?There is no height or weight on file to calculate BMI. ? ?Physical Exam  ? ?Physical Exam ?Gen:  A&O x4, NAD ?HEENT: Atraumatic, normocephalic;mucous membranes moist; oropharynx clear, tongue without atrophy or fasciculations. ?Neck: Supple, trachea midline. ?Resp: CTAB, no w/r/r ?CV: RRR, no m/g/r; nml S1 and S2. 2+ symmetric peripheral pulses. ?Abd: soft/NT/ND; nabs x 4 quad ?Extrem: Nml bulk; no cyanosis, clubbing, or edema. ? ?Neuro: ?*MS: A&O x4. Follows multi-step commands.  ?*Speech: fluid, nondysarthric, able to name and repeat ?*CN:  ?  I: Deferred ?  II,III: PERRLA, VFF by confrontation, optic discs unable to be visualized 2/2 pupillary constriction ?  III,IV,VI: EOMI w/o nystagmus, no ptosis ?  V: Sensation intact from V1 to V3 to LT ?  VII: Eyelid closure was full.  L ptosis. L lower facial weakness mild, forehead spared. ?  VIII: Hearing intact to voice ?  IX,X: Voice normal, palate elevates symmetrically  ?  XI: SCM/trap 5/5 bilat   ?XII: Tongue protrudes midline, no atrophy or fasciculations  ? ?*Motor:   Normal bulk.  No tremor, rigidity or bradykinesia. No pronator drift. ? ?  Strength: Dlt Bic Tri WrE WrF FgS Gr HF KnF KnE PlF DoF  ?  Left 5 5 5 5 5 5 5 5 5 5 5 5   ?  Right 5 5 5 5 5 5 5 5 5 5 5 5   ? ? ?*Sensory: Intact to light touch, pinprick, temperature vibration throughout. Symmetric. Propioception intact bilat.  No double-simultaneous extinction.  ?*Coordination:  Finger-to-nose, heel-to-shin, rapid alternating motions were intact. ?*Reflexes:  2+ and symmetric throughout without clonus; toes down-going bilat ?*Gait: deferred ? ?Labs  ? ?CBC:  ?Recent Labs  ?Lab  06/07/21 ?2324 06/07/21 ?2339  ?WBC 9.6  --   ?NEUTROABS 5.7  --   ?HGB 11.9* 12.6  ?HCT 37.4 37.0  ?MCV 84.6  --   ?PLT 348  --   ? ? ?Basic Metabolic Panel:  ?Lab Results  ?Component Value Date  ? NA 137 03

## 2021-06-09 NOTE — Progress Notes (Signed)
SLP Cancellation Note ? ?Patient Details ?Name: Veronica Harding ?MRN: [id number] ?DOB: 20-Jul-1965 ? ? ?Cancelled treatment:       Reason Eval/Treat Not Completed: SLP screened, no needs identified, will sign off. Pt did not present with any acute speech, language, or cognitive-linguistic deficits on admission and MRI was negative for acute changes. A formal evaluation does not appear to be clinically indicated at this time. SLP will sign off.  ? ?Griselda Tosh I. Vear Clock, MS, CCC-SLP ?Acute Rehabilitation Services ?Office number [phone] ?Pager [phone] ? ? ?Scheryl Marten ?06/09/2021, 2:39 PM ?

## 2021-06-09 NOTE — Plan of Care (Signed)
?  Problem: Education: ?Goal: Knowledge of disease or condition will improve ?Outcome: Progressing ?Goal: Knowledge of secondary prevention will improve (SELECT ALL) ?Outcome: Progressing ?Goal: Knowledge of patient specific risk factors will improve (INDIVIDUALIZE FOR PATIENT) ?Outcome: Progressing ?  ?

## 2021-06-09 NOTE — Progress Notes (Signed)
OT Cancellation Note ? ?Patient Details ?Name: Veronica Harding ?MRN: [id number] ?DOB: 1965/09/16 ? ? ?Cancelled Treatment:    Reason Eval/Treat Not Completed: OT screened, no needs identified, will sign off.   ? ?Norville Dani OTR/L ?06/09/2021, 9:22 AM ?

## 2021-06-09 NOTE — Assessment & Plan Note (Signed)
Unclear baseline, likely ckd, follow ?

## 2021-06-09 NOTE — Assessment & Plan Note (Signed)
Transitioned to atorva ?LDL 108 ?

## 2021-06-09 NOTE — Progress Notes (Signed)
?PROGRESS NOTE ? ? ? Veronica Harding  GEX:[id number] DOB: 06/22/1965 DOA: 06/07/2021 ?PCP: [facility]  ?Chief Complaint  ?Patient presents with  ? Numbness  ? ? ?Brief Narrative:  ? KYMORAH Harding is Veronica Harding 56 y.o. female with medical history significant of hypothyroidism; anxiety; and OSA presenting with L facial numbness and droop.   She reports that Saturday AM she awoke after Veronica Harding short night of sleep.  She looked in the mirror and her left eye looked different.  As the day progressed, the L face started tingling and the left eye started to feel different.  Symptoms worsened during the day with regards to thinking and talking at the same time. She was off balance and slurred her speech some.  Symptoms cleared in the waiting room but she has had [place] headache across the back of her head and also with R eye pain.  She continues to have Pervis Macintyre mild headache and her left eye is still different from usual.  The left eye is still "not as open" as usual.  No dysphagia but her mouth has been very dry. ?  ?  ?  ?ER Course:  Carryover, per Dr. Arville Care: ?  ?56 year old female with H/o ADD, depression, DM 2, dyslipidemia and hypothyroidism, coming with mild dehydration and AKI and facial droop with slurred speech concerning for TIA.  Brain MRI is pending.  Dr. Otelia Limes was notified and recommended official consult after her MRI results. ?   ? ? ?Assessment & Plan: ?  ?Active Problems: ?  Stroke-like symptoms ?  Hypertension ?  HLD (hyperlipidemia) ?  Obesity, Class III, BMI 40-49.9 (morbid obesity) (HCC) ?  Anxiety and depression ?  Hypothyroidism ?  Sleep apnea ?  Elevated serum creatinine ?  TIA (transient ischemic attack) ? ? ?Assessment and Plan: ?Stroke-like symptoms ?L sided facial droop (with forehead sparing) is persistent ?Also had L face tingling, slurred speech, headache, and feeling off balance -> these sx have improved ?MRI brain without acute findings.  Mild white matter dz with nonspecific pattern, given hx, premature  small vessel dz or complicated migraine are leading considerations.  MRA negative. ?Carotid US with carotids near normal, bilateral vertebral arteries with antegrade flow ?Echo with EF 60-65%, no RWMA, mild LVH ?LDL 108, a1c 5.5 ?UDS positive for amphetamines ?PT/OT/SLP ?Aspirin, atorva 40 mg ?Due to persistent L facial weakness, discussed with neurology who's planning to evaluate with additional imaging, acetylcholine receptor ab ? ?Hypertension ?Lisinopril currently on hold ? ?HLD (hyperlipidemia) ?Transitioned to atorva ?LDL 108 ? ?Obesity, Class III, BMI 40-49.9 (morbid obesity) (HCC) ?-Prior BMI was 46.1; current weight is requested ?-Weight loss should be encouraged ?-Outpatient PCP/bariatric medicine/bariatric surgery f/u encouraged ? ?Anxiety and depression ?-Continue Bupropion, fluoxetine, trazodone ?-Hold ADHD medications - she will not be performing tasks that require attention and focus as an inpatient ? ?Hypothyroidism ?-Normal TSH ?-Continue Synthroid at current dose for now ? ?Sleep apnea ?-Not on CPAP ? ?Elevated serum creatinine ?Unclear baseline, likely ckd, follow ? ? ?DVT prophylaxis: lovenox ?Code Status: full ?Family Communication: none ?Disposition:  ? ?Status is: Observation ?The patient will require care spanning > 2 midnights and should be moved to inpatient because: need for additional neuro w/u ?  ?Consultants:  ?neuro ? ?Procedures:  ?Echo ?IMPRESSIONS  ? ? ? 1. Left ventricular ejection fraction, by estimation, is 60 to 65%. The  ?left ventricle has normal function. The left ventricle has no regional  ?wall motion abnormalities. There is mild left ventricular  hypertrophy.  ?Left ventricular diastolic parameters  ?were normal.  ? 2. Right ventricular systolic function is normal. The right ventricular  ?size is normal.  ? 3. The mitral valve is normal in structure. Trivial mitral valve  ?regurgitation. No evidence of mitral stenosis.  ? 4. The aortic valve is normal in structure. Aortic  valve regurgitation is  ?not visualized. No aortic stenosis is present.  ? 5. The inferior vena cava is normal in size with greater than 50%  ?respiratory variability, suggesting right atrial pressure of 3 mmHg.  ? ?Comparison(s): No prior Echocardiogram.  ? ?Summary:  ?Right Carotid: The extracranial vessels were near-normal with only minimal  ?wall  ?               thickening or plaque.  ? ?Left Carotid: The extracranial vessels were near-normal with only minimal  ?wall  ?              thickening or plaque.  ? ?Vertebrals:  Bilateral vertebral arteries demonstrate antegrade flow.  ?Subclavians: Normal flow hemodynamics were seen in bilateral subclavian  ?             arteries.  ? ?Antimicrobials:  ?Anti-infectives (From admission, onward)  ? ? None  ? ?  ? ? ?Subjective: ?No new complaints ? ?Objective: ?Vitals:  ? 06/09/21 0751 06/09/21 0800 06/09/21 1134 06/09/21 1649  ?BP: 95/60  109/72 116/73  ?Pulse: 68  78 71  ?Resp: 20 16 20 14   ?Temp: 97.9 ?F (36.6 ?C)  98.1 ?F (36.7 ?C) 98 ?F (36.7 ?C)  ?TempSrc: Oral  Oral Oral  ?SpO2: 99%  97% 100%  ? ? ?Intake/Output Summary (Last 24 hours) at 06/09/2021 1910 ?Last data filed at 06/09/2021 1700 ?Gross per 24 hour  ?Intake 863 ml  ?Output --  ?Net 863 ml  ? ?There were no vitals filed for this visit. ? ?Examination: ? ?General exam: Appears calm and comfortable  ?Respiratory system: Clear to auscultation. Respiratory effort normal. ?Cardiovascular system: S1 & S2 heard, RRR. ?Gastrointestinal system: Abdomen is nondistended, soft and nontender ?Central nervous system: L sided facial weakness, forehead sparing ?Extremities: no lee ?Skin: No rashes, lesions or ulcers ?Psychiatry: Judgement and insight appear normal. Mood & affect appropriate.  ? ? ? ?Data Reviewed: I have personally reviewed following labs and imaging studies ? ?CBC: ?Recent Labs  ?Lab 06/07/21 ?2324 06/07/21 ?2339  ?WBC 9.6  --   ?NEUTROABS 5.7  --   ?HGB 11.9* 12.6  ?HCT 37.4 37.0  ?MCV 84.6  --   ?PLT  348  --   ? ? ?Basic Metabolic Panel: ?Recent Labs  ?Lab 06/07/21 ?2324 06/07/21 ?2339 06/09/21 ?1610  ?NA 136 137 137  ?K 4.1 4.1 4.2  ?CL 104 104 106  ?CO2 23  --  24  ?GLUCOSE 108* 110* 84  ?BUN 23* 27* 22*  ?CREATININE 1.60* 1.70* 1.52*  ?CALCIUM 9.0  --  8.7*  ? ? ?GFR: ?CrCl cannot be calculated (Unknown ideal weight.). ? ?Liver Function Tests: ?Recent Labs  ?Lab 06/07/21 ?2324  ?AST 15  ?ALT 13  ?ALKPHOS 106  ?BILITOT 0.5  ?PROT 6.9  ?ALBUMIN 3.5  ? ? ?CBG: ?Recent Labs  ?Lab 06/08/21 ?1824 06/08/21 ?2332 06/09/21 ?06/11/21 06/09/21 ?1237 06/09/21 ?1648  ?GLUCAP 75 87 80 94 95  ? ? ? ?Recent Results (from the past 240 hour(s))  ?Resp Panel by RT-PCR (Flu [place]&B, Covid) Nasopharyngeal Swab     Status: None  ? Collection Time: 06/07/21 11:07 PM  ?  Specimen: Nasopharyngeal Swab; Nasopharyngeal(NP) swabs in vial transport medium  ?Result Value Ref Range Status  ? SARS Coronavirus 2 by RT PCR NEGATIVE NEGATIVE Final  ?  Comment: (NOTE) ?SARS-CoV-2 target nucleic acids are NOT DETECTED. ? ?The SARS-CoV-2 RNA is generally detectable in upper respiratory ?specimens during the acute phase of infection. The lowest ?concentration of SARS-CoV-2 viral copies this assay can detect is ?138 copies/mL. Irie Dowson negative result does not preclude SARS-Cov-2 ?infection and should not be used as the sole basis for treatment or ?other patient management decisions. Helder Crisafulli negative result may occur with  ?improper specimen collection/handling, submission of specimen other ?than nasopharyngeal swab, presence of viral mutation(s) within the ?areas targeted by this assay, and inadequate number of viral ?copies(<138 copies/mL). Zannah Melucci negative result must be combined with ?clinical observations, patient history, and epidemiological ?information. The expected result is Negative. ? ?Fact Sheet for Patients:  ?BloggerCourse.comhttps://www.fda.gov/media/152166/download ? ?Fact Sheet for Healthcare Providers:  ?SeriousBroker.ithttps://www.fda.gov/media/152162/download ? ?This test is no t yet  approved or cleared by the [place] FDA and  ?has been authorized for detection and/or diagnosis of SARS-CoV-2 by ?FDA under an Emergency Use Authorization (EUA). This EUA will remain  ?in effect (meani

## 2021-06-09 NOTE — Assessment & Plan Note (Addendum)
L sided facial droop (with forehead sparing) is persistent ?Also had L face tingling, slurred speech, headache, and feeling off balance -> these sx have improved ?MRI brain without acute findings.  Mild white matter dz with nonspecific pattern, given hx, premature small vessel dz or complicated migraine are leading considerations.  MRA negative. ?Carotid US with carotids near normal, bilateral vertebral arteries with antegrade flow ?Echo with EF 60-65%, no RWMA, mild LVH ?LDL 108, a1c 5.5 ?UDS positive for amphetamines ?PT/OT/SLP ?Aspirin, atorva 40 mg ?Appreciate neurology recommendations -> negative MRI brain with contrast, negative MRA of neck.  At this time suspect mild bell's palsy, discharge on 60 mg prednisone daily x7 days.  Follow with PCP outpatient, can follow with neurology if sx persistent.  Follow pending acetylcholine receptor ab. ?

## 2021-06-09 NOTE — Progress Notes (Incomplete)
?  Echocardiogram ?2D Echocardiogram has been performed. ? ?Janalyn Harder ?06/09/2021, 12:35 PM ?

## 2021-06-09 NOTE — Progress Notes (Signed)
Neurology brief recommendations ? ?Contacted by Dr. Florene Glen regarding this patient with hx hypothyroidism, anxiety, and OSA who presented with L facial numbness and weakness. Her MRI brain was negative for stroke and TIA workup is complete except for TTE results which are still pending. Dr. Florene Glen would like to know if neurology has any further recommendations. ? ?Review of TIA workup this admission: ? ?MRI brain no acute infarct, CSVID ? ?MRA head: normal ? ?Carotid US: near-normal carotid flow bilat, anterrograde flow bilat verts ? ?TTE pending ? ?A1c 5.5 ? ?LDL 108 ? ?Recommendations: ?- If TTE shows no intracardiac clot or other significant abnl, patient may be discharged home from neuro standpoint ?- At discharge should be continued on ASA 81mg  daily ?- Change pravastatin to atorvastatin 40mg  daily ?- F/u with PCP ? ?Please contact neuro for any further questions ? ?Su Monks, MD ?[facility] ?[phone] ? ?If 7pm- 7am, please page neurology on call as listed in [place]. ? ?

## 2021-06-09 NOTE — Progress Notes (Signed)
?  Transition of Care (TOC) Screening Note ? ? ?Patient Details  ?Name: Veronica Harding ?Date of Birth: 06-18-1965 ? ? ?Transition of Care (TOC) CM/SW Contact:    ?Kermit Balo, RN ?Phone Number: ?06/09/2021, 2:41 PM ? ? ?Patient is from home with son. No f/u per PT/OT.  ?Transition of Care Department [facility]) has reviewed patient. We will continue to monitor patient advancement through interdisciplinary progression rounds.  ?  ?

## 2021-06-09 NOTE — Hospital Course (Addendum)
Veronica Harding is Veronica Harding 56 y.o. female with medical history significant of hypothyroidism; anxiety; and OSA presenting with L facial numbness and droop.    ?  ?Her imaging was negative for stroke.  Seen by neurology who recommended steroids for 7 days to treat possible bells palsy.  Follow with PCP outpatient, if not improving, follow with neurology.  ?

## 2021-06-09 NOTE — Plan of Care (Signed)
  Problem: Education: Goal: Knowledge of disease or condition will improve Outcome: Progressing Goal: Knowledge of secondary prevention will improve (SELECT ALL) Outcome: Progressing Goal: Knowledge of patient specific risk factors will improve (INDIVIDUALIZE FOR PATIENT) Outcome: Progressing   Problem: Coping: Goal: Will verbalize positive feelings about self Outcome: Progressing Goal: Will identify appropriate support needs Outcome: Progressing   Problem: Health Behavior/Discharge Planning: Goal: Ability to manage health-related needs will improve Outcome: Progressing   Problem: Self-Care: Goal: Ability to participate in self-care as condition permits will improve Outcome: Progressing Goal: Verbalization of feelings and concerns over difficulty with self-care will improve Outcome: Progressing Goal: Ability to communicate needs accurately will improve Outcome: Progressing   Problem: Nutrition: Goal: Risk of aspiration will decrease Outcome: Progressing Goal: Dietary intake will improve Outcome: Progressing   Problem: Intracerebral Hemorrhage Tissue Perfusion: Goal: Complications of Intracerebral Hemorrhage will be minimized Outcome: Progressing   Problem: Ischemic Stroke/TIA Tissue Perfusion: Goal: Complications of ischemic stroke/TIA will be minimized Outcome: Progressing   Problem: Spontaneous Subarachnoid Hemorrhage Tissue Perfusion: Goal: Complications of Spontaneous Subarachnoid Hemorrhage will be minimized Outcome: Progressing   

## 2021-06-09 NOTE — Assessment & Plan Note (Addendum)
lisinopril 

## 2021-06-10 DIAGNOSIS — R200 Anesthesia of skin: Secondary | ICD-10-CM | POA: Diagnosis present

## 2021-06-10 DIAGNOSIS — R29810 Facial weakness: Secondary | ICD-10-CM | POA: Diagnosis present

## 2021-06-10 DIAGNOSIS — H5711 Ocular pain, right eye: Secondary | ICD-10-CM | POA: Diagnosis present

## 2021-06-10 DIAGNOSIS — K219 Gastro-esophageal reflux disease without esophagitis: Secondary | ICD-10-CM | POA: Diagnosis present

## 2021-06-10 DIAGNOSIS — Z66 Do not resuscitate: Secondary | ICD-10-CM | POA: Diagnosis present

## 2021-06-10 DIAGNOSIS — E039 Hypothyroidism, unspecified: Secondary | ICD-10-CM | POA: Diagnosis present

## 2021-06-10 DIAGNOSIS — E860 Dehydration: Secondary | ICD-10-CM | POA: Diagnosis present

## 2021-06-10 DIAGNOSIS — R2990 Unspecified symptoms and signs involving the nervous system: Secondary | ICD-10-CM | POA: Diagnosis not present

## 2021-06-10 DIAGNOSIS — R7989 Other specified abnormal findings of blood chemistry: Secondary | ICD-10-CM | POA: Diagnosis present

## 2021-06-10 DIAGNOSIS — I129 Hypertensive chronic kidney disease with stage 1 through stage 4 chronic kidney disease, or unspecified chronic kidney disease: Secondary | ICD-10-CM | POA: Diagnosis present

## 2021-06-10 DIAGNOSIS — N179 Acute kidney failure, unspecified: Secondary | ICD-10-CM | POA: Diagnosis present

## 2021-06-10 DIAGNOSIS — E785 Hyperlipidemia, unspecified: Secondary | ICD-10-CM | POA: Diagnosis present

## 2021-06-10 DIAGNOSIS — G459 Transient cerebral ischemic attack, unspecified: Secondary | ICD-10-CM | POA: Diagnosis present

## 2021-06-10 DIAGNOSIS — G4733 Obstructive sleep apnea (adult) (pediatric): Secondary | ICD-10-CM | POA: Diagnosis present

## 2021-06-10 DIAGNOSIS — I10 Essential (primary) hypertension: Secondary | ICD-10-CM | POA: Diagnosis not present

## 2021-06-10 DIAGNOSIS — N189 Chronic kidney disease, unspecified: Secondary | ICD-10-CM | POA: Diagnosis present

## 2021-06-10 DIAGNOSIS — R4781 Slurred speech: Secondary | ICD-10-CM | POA: Diagnosis present

## 2021-06-10 DIAGNOSIS — Z6841 Body Mass Index (BMI) 40.0 and over, adult: Secondary | ICD-10-CM | POA: Diagnosis not present

## 2021-06-10 DIAGNOSIS — K5900 Constipation, unspecified: Secondary | ICD-10-CM | POA: Diagnosis present

## 2021-06-10 DIAGNOSIS — E1122 Type 2 diabetes mellitus with diabetic chronic kidney disease: Secondary | ICD-10-CM | POA: Diagnosis present

## 2021-06-10 DIAGNOSIS — F32A Depression, unspecified: Secondary | ICD-10-CM | POA: Diagnosis present

## 2021-06-10 DIAGNOSIS — Z20822 Contact with and (suspected) exposure to covid-19: Secondary | ICD-10-CM | POA: Diagnosis present

## 2021-06-10 DIAGNOSIS — G4700 Insomnia, unspecified: Secondary | ICD-10-CM | POA: Diagnosis present

## 2021-06-10 DIAGNOSIS — F419 Anxiety disorder, unspecified: Secondary | ICD-10-CM | POA: Diagnosis present

## 2021-06-10 DIAGNOSIS — G4730 Sleep apnea, unspecified: Secondary | ICD-10-CM | POA: Diagnosis not present

## 2021-06-10 DIAGNOSIS — F909 Attention-deficit hyperactivity disorder, unspecified type: Secondary | ICD-10-CM | POA: Diagnosis present

## 2021-06-10 LAB — CBC WITH DIFFERENTIAL/PLATELET
Abs Immature Granulocytes: 0.02 10*3/uL (ref 0.00–0.07)
Basophils Absolute: 0 10*3/uL (ref 0.0–0.1)
Basophils Relative: 1 %
Eosinophils Absolute: 0.1 10*3/uL (ref 0.0–0.5)
Eosinophils Relative: 2 %
HCT: 35.4 % — ABNORMAL LOW (ref 36.0–46.0)
Hemoglobin: 11.7 g/dL — ABNORMAL LOW (ref 12.0–15.0)
Immature Granulocytes: 0 %
Lymphocytes Relative: 32 %
Lymphs Abs: 2.2 10*3/uL (ref 0.7–4.0)
MCH: 27.5 pg (ref 26.0–34.0)
MCHC: 33.1 g/dL (ref 30.0–36.0)
MCV: 83.1 fL (ref 80.0–100.0)
Monocytes Absolute: 0.4 10*3/uL (ref 0.1–1.0)
Monocytes Relative: 5 %
Neutro Abs: 4.1 10*3/uL (ref 1.7–7.7)
Neutrophils Relative %: 60 %
Platelets: 237 10*3/uL (ref 150–400)
RBC: 4.26 MIL/uL (ref 3.87–5.11)
RDW: 12.6 % (ref 11.5–15.5)
WBC: 6.8 10*3/uL (ref 4.0–10.5)
nRBC: 0 % (ref 0.0–0.2)

## 2021-06-10 LAB — COMPREHENSIVE METABOLIC PANEL
ALT: 12 U/L (ref 0–44)
AST: 14 U/L — ABNORMAL LOW (ref 15–41)
Albumin: 3.1 g/dL — ABNORMAL LOW (ref 3.5–5.0)
Alkaline Phosphatase: 87 U/L (ref 38–126)
Anion gap: 8 (ref 5–15)
BUN: 19 mg/dL (ref 6–20)
CO2: 24 mmol/L (ref 22–32)
Calcium: 8.8 mg/dL — ABNORMAL LOW (ref 8.9–10.3)
Chloride: 107 mmol/L (ref 98–111)
Creatinine, Ser: 1.46 mg/dL — ABNORMAL HIGH (ref 0.44–1.00)
GFR, Estimated: 43 mL/min — ABNORMAL LOW (ref >60)
Glucose, Bld: 85 mg/dL (ref 70–99)
Potassium: 4.1 mmol/L (ref 3.5–5.1)
Sodium: 139 mmol/L (ref 135–145)
Total Bilirubin: 0.2 mg/dL — ABNORMAL LOW (ref 0.3–1.2)
Total Protein: 6.4 g/dL — ABNORMAL LOW (ref 6.5–8.1)

## 2021-06-10 LAB — PHOSPHORUS: Phosphorus: 4.4 mg/dL (ref 2.5–4.6)

## 2021-06-10 LAB — GLUCOSE, CAPILLARY
Glucose-Capillary: 81 mg/dL (ref 70–99)
Glucose-Capillary: 89 mg/dL (ref 70–99)

## 2021-06-10 LAB — HIV ANTIBODY (ROUTINE TESTING W REFLEX): HIV Screen 4th Generation wRfx: NONREACTIVE

## 2021-06-10 LAB — MAGNESIUM: Magnesium: 1.9 mg/dL (ref 1.7–2.4)

## 2021-06-10 MED ORDER — PREDNISONE 20 MG PO TABS: 60.0000 mg | ORAL_TABLET | Freq: Every day | ORAL | 0 refills | Status: AC

## 2021-06-10 MED ORDER — ATORVASTATIN CALCIUM 40 MG PO TABS: 40.0000 mg | ORAL_TABLET | Freq: Every day | ORAL | 0 refills | Status: DC

## 2021-06-10 NOTE — Progress Notes (Signed)
Discharge instructions, RX's and follow up appts explained and provided to patient verbalized understanding. Work note printed and provided to patient. Patient left floor via wheelchair accompanied by staff. No c/o pain or shortness of breath at d/c. ? ?Brionne Mertz, Kae Heller, RN  ?

## 2021-06-10 NOTE — Discharge Summary (Addendum)
Physician Discharge Summary  ?[name] IPJ:[id number] DOB: September 05, 1965 DOA: 06/07/2021 ? ?PCP: [facility] ? ?Admit date: 06/07/2021 ?Discharge date: 06/10/2021 ? ?Time spent: 40 minutes ? ?Recommendations for Outpatient Follow-up:  ?Follow outpatient CBC/CMP  ?Follow with neurology outpatient if not improving  ?Follow pending acetylcholine receptor ab ?Follow with your PCP outpatient regarding renal function, likely degree of CKD ? ?Discharge Diagnoses:  ?Principal Problem: ?  Stroke-like symptoms ?Active Problems: ?  Hypertension ?  HLD (hyperlipidemia) ?  Obesity, Class III, BMI 40-49.9 (morbid obesity) (HCC) ?  Anxiety and depression ?  Hypothyroidism ?  Sleep apnea ?  Elevated serum creatinine ?  TIA (transient ischemic attack) ? ? ?Discharge Condition: stable ? ?Diet recommendation: heart healthy ? ? ?Filed Weights  ? 06/10/21 0500  ?Weight: 110.5 kg  ? ? ?History of present illness:  ? Veronica Harding is Veronica Harding 56 y.o. female with medical history significant of hypothyroidism; anxiety; and OSA presenting with L facial numbness and droop.    ?  ?Her imaging was negative for stroke.  Seen by neurology who recommended steroids for 7 days to treat possible bells palsy.  Follow with PCP outpatient, if not improving, follow with neurology.  ? ?Hospital Course:  ?Assessment and Plan: ?* Stroke-like symptoms ?L sided facial droop (with forehead sparing) is persistent ?Also had L face tingling, slurred speech, headache, and feeling off balance -> these sx have improved ?MRI brain without acute findings.  Mild white matter dz with nonspecific pattern, given hx, premature small vessel dz or complicated migraine are leading considerations.  MRA negative. ?Carotid US with carotids near normal, bilateral vertebral arteries with antegrade flow ?Echo with EF 60-65%, no RWMA, mild LVH ?LDL 108, a1c 5.5 ?UDS positive for amphetamines ?PT/OT/SLP ?Aspirin, atorva 40 mg ?Appreciate neurology recommendations -> negative MRI  brain with contrast, negative MRA of neck.  At this time suspect mild bell's palsy, discharge on 60 mg prednisone daily x7 days.  Follow with PCP outpatient, can follow with neurology if sx persistent.  Follow pending acetylcholine receptor ab. ? ?Hypertension ?lisinopril ? ?HLD (hyperlipidemia) ?Transitioned to atorva ?LDL 108 ? ?Obesity, Class III, BMI 40-49.9 (morbid obesity) (HCC) ?-Prior BMI was 46.1; current weight is requested ?-Weight loss should be encouraged ?-Outpatient PCP/bariatric medicine/bariatric surgery f/u encouraged ? ?Anxiety and depression ?-Continue Bupropion, fluoxetine, trazodone ?- resume adhd meds ? ?Hypothyroidism ?-Normal TSH ?-Continue Synthroid at current dose for now ? ?Sleep apnea ?-Not on CPAP ? ?Elevated serum creatinine ?Unclear baseline, likely ckd, follow ? ? ?Procedures: ?Echo ?IMPRESSIONS  ? ? ? 1. Left ventricular ejection fraction, by estimation, is 60 to 65%. The  ?left ventricle has normal function. The left ventricle has no regional  ?wall motion abnormalities. There is mild left ventricular hypertrophy.  ?Left ventricular diastolic parameters  ?were normal.  ? 2. Right ventricular systolic function is normal. The right ventricular  ?size is normal.  ? 3. The mitral valve is normal in structure. Trivial mitral valve  ?regurgitation. No evidence of mitral stenosis.  ? 4. The aortic valve is normal in structure. Aortic valve regurgitation is  ?not visualized. No aortic stenosis is present.  ? 5. The inferior vena cava is normal in size with greater than 50%  ?respiratory variability, suggesting right atrial pressure of 3 mmHg.  ? ?Comparison(s): No prior Echocardiogram.   ? ?Summary:  ?Right Carotid: The extracranial vessels were near-normal with only minimal  ?wall  ?  thickening or plaque.  ? ?Left Carotid: The extracranial vessels were near-normal with only minimal  ?wall  ?              thickening or plaque.  ? ?Vertebrals:  Bilateral vertebral arteries  demonstrate antegrade flow.  ?Subclavians: Normal flow hemodynamics were seen in bilateral subclavian  ?             arteries.  ? ?Consultations: ?neurology ? ?Discharge Exam: ?Vitals:  ? 06/10/21 1103 06/10/21 1503  ?BP: 115/66 115/72  ?Pulse: 69 74  ?Resp: 18   ?Temp: 98 ?F (36.7 ?C) 98.6 ?F (37 ?C)  ?SpO2: 100% 97%  ? ?No new complaints ?Discussed d/c plan ? ?General: No acute distress. ?Cardiovascular: RRR ?Lungs: unlabored ?Abdomen: Soft, nontender, nondistended  ?Neurological: Alert and oriented ?3.  L sided facial droop with forehead spearing. Moves all extremities ?4. Cranial nerves II through XII grossly intact. ?Skin: Warm and dry. No rashes or lesions. ?Extremities: No clubbing or cyanosis. No edema.  ? ?Discharge Instructions ? ? ?Discharge Instructions   ? ? Call MD for:  difficulty breathing, headache or visual disturbances   Complete by: As directed ?  ? Call MD for:  extreme fatigue   Complete by: As directed ?  ? Call MD for:  hives   Complete by: As directed ?  ? Call MD for:  persistant dizziness or light-headedness   Complete by: As directed ?  ? Call MD for:  persistant nausea and vomiting   Complete by: As directed ?  ? Call MD for:  redness, tenderness, or signs of infection (pain, swelling, redness, odor or green/yellow discharge around incision site)   Complete by: As directed ?  ? Call MD for:  severe uncontrolled pain   Complete by: As directed ?  ? Call MD for:  temperature >100.4   Complete by: As directed ?  ? Diet - low sodium heart healthy   Complete by: As directed ?  ? Discharge instructions   Complete by: As directed ?  ? You were seen for left sided facial droop. ? ?Your MRI was negative for stroke.  We'll treat you empirically for bells palsy.  Please follow up with your PCP outpatient.  If you have persistent symptoms, they should refer you to neurology as an outpatient. ? ?We've started you on lipitor.   ? ?You have pending labs testing for myasthenia gravis, follow these with  you PCP outpatient (acetylcholine receptor ab). ? ?Return for new, recurrent, or worsening symptoms. ? ?Please ask your PCP to request records from this hospitalization so they know what was done and what the next steps will be.  ? Increase activity slowly   Complete by: As directed ?  ? ?  ? ?Allergies as of 06/10/2021   ? ?   Reactions  ? Lactase-lactobacillus   ? ?  ? ?  ?Medication List  ?  ? ?STOP taking these medications   ? ?pravastatin 20 MG tablet ?Commonly known as: PRAVACHOL ?  ? ?  ? ?TAKE these medications   ? ?albuterol 108 (90 Base) MCG/ACT inhaler ?Commonly known as: VENTOLIN HFA ?Inhale 2 puffs into the lungs every 6 (six) hours as needed for wheezing or shortness of breath. 2 puffs every 4 to 6 hours as needed ?  ?Amitiza 8 MCG capsule ?Generic drug: lubiprostone ?Take 8 mcg by mouth 2 (two) times daily. ?  ?amphetamine-dextroamphetamine 5 MG tablet ?Commonly known as: ADDERALL ?Take 5 mg by mouth  2 (two) times daily with Jovan Schickling meal. ?  ?aspirin EC 81 MG tablet ?Take 81 mg by mouth daily. Swallow whole. ?  ?atorvastatin 40 MG tablet ?Commonly known as: LIPITOR ?Take 1 tablet (40 mg total) by mouth daily. ?Start taking on: June 11, 2021 ?  ?buPROPion 150 MG 24 hr tablet ?Commonly known as: WELLBUTRIN XL ?Take 150 mg by mouth daily. ?  ?ergocalciferol 1.25 MG (50000 UT) capsule ?Commonly known as: VITAMIN D2 ?Take 50,000 Units by mouth once Jackilyn Umphlett week. ?  ?FLUoxetine 20 MG tablet ?Commonly known as: PROZAC ?Take 20 mg by mouth daily. ?  ?levothyroxine 50 MCG tablet ?Commonly known as: SYNTHROID ?Take 50 mcg by mouth daily. ?  ?lisinopril 20 MG tablet ?Commonly known as: ZESTRIL ?Take 20 mg by mouth daily. ?  ?predniSONE 20 MG tablet ?Commonly known as: DELTASONE ?Take 3 tablets (60 mg total) by mouth daily for 7 days. ?  ?traZODone 50 MG tablet ?Commonly known as: DESYREL ?Take 50 mg by mouth at bedtime. ?  ?Utibron Neohaler 27.5-15.6 MCG Caps ?Generic drug: Indacaterol-Glycopyrrolate ?Place 1 capsule into  inhaler and inhale 2 (two) times daily as needed. ?  ? ?  ? ?Allergies  ?Allergen Reactions  ? Lactase-Lactobacillus   ? ? ? ? ?The results of significant diagnostics from this hospitalization (including im

## 2021-06-10 NOTE — TOC Transition Note (Signed)
Transition of Care (TOC) - CM/SW Discharge Note ? ? ?Patient Details  ?Name: Shonia Skilling Meddings ?MRN: [id number] ?Date of Birth: 1965/12/21 ? ?Transition of Care (TOC) CM/SW Contact:  ?Kermit Balo, RN ?Phone Number: ?06/10/2021, 3:06 PM ? ? ?Clinical Narrative:    ?Patient is discharging to home with self care. No needs per TOC. ? ? ?Final next level of care: Home/Self Care ?Barriers to Discharge: No Barriers Identified ? ? ?Patient Goals and CMS Choice ?  ?  ?  ? ?Discharge Placement ?  ?           ?  ?  ?  ?  ? ?Discharge Plan and Services ?  ?  ?           ?  ?  ?  ?  ?  ?  ?  ?  ?  ?  ? ?Social Determinants of Health (SDOH) Interventions ?  ? ? ?Readmission Risk Interventions ?   ? View : No data to display.  ?  ?  ?  ? ? ? ? ? ?

## 2021-06-10 NOTE — Plan of Care (Signed)
  Problem: Education: Goal: Knowledge of disease or condition will improve Outcome: Progressing Goal: Knowledge of secondary prevention will improve (SELECT ALL) Outcome: Progressing   

## 2021-06-10 NOTE — Progress Notes (Signed)
Nutrition Brief Note ? ?RD consulted to assess patient's nutritional status due to stroke-like symptoms.  ? ?Admitting Dx: TIA (transient ischemic attack) [G45.9] ?Paresthesia [R20.2] ?AKI (acute kidney injury) (HCC) [N17.9] ?Stroke-like symptoms [R29.90] ?PMH:  ?Past Medical History:  ?Diagnosis Date  ? Anxiety and depression   ? Constipation   ? Dyslipidemia   ? GERD (gastroesophageal reflux disease)   ? Hypertension   ? Hypothyroidism   ? Insomnia   ? Obesity, Class III, BMI 40-49.9 (morbid obesity) (HCC) 06/08/2021  ? Sleep apnea   ? does not wear CPAP  ? ? ?Medications:  ? insulin aspart  0-15 Units Subcutaneous TID WC  ? insulin aspart  0-5 Units Subcutaneous QHS  ?Labs: ?Recent Labs  ?Lab 06/07/21 ?2324 06/07/21 ?2339 06/09/21 ?1610 06/10/21 ?3474  ?NA 136 137 137 139  ?K 4.1 4.1 4.2 4.1  ?CL 104 104 106 107  ?CO2 23  --  24 24  ?BUN 23* 27* 22* 19  ?CREATININE 1.60* 1.70* 1.52* 1.46*  ?CALCIUM 9.0  --  8.7* 8.8*  ?MG  --   --   --  1.9  ?PHOS  --   --   --  4.4  ?GLUCOSE 108* 110* 84 85  ?CBGs 75-103 x 24 hours ? ?Wt Readings from Last 15 Encounters:  ?06/10/21 110.5 kg  ?04/28/21 107 kg  ?11/16/16 127 kg  ?07/24/15 129.3 kg  ?Body mass index is 47.58 kg/m?Marland Kitchen Patient meets criteria for morbid obesity based on current BMI.  ? ?Current diet order is heart healthy/carb modified, patient is consuming approximately 100% of meals at this time.  ? ?No nutrition interventions warranted at this time. If nutrition issues arise, please consult RD.  ? ? ?Rae Lips., MS, RD, LDN (she/her/hers) ?RD pager number and weekend/on-call pager number located in [place]. ? ? ? ? ?

## 2021-06-10 NOTE — Progress Notes (Signed)
Discharge instructions given. Patient verbalized understanding and all questions were answered.  ?

## 2021-06-10 NOTE — Plan of Care (Signed)
Neurology plan of care ? ?MRI brain w contrast WNL. Prior MRI brain wo contrast WNL. MRA neck and head unremarkable. Patient favored to have mild Bell's palsy House-Brackmann grade II. OK to discharge from neuro standpoint on 60mg  po prednisone daily x7 days no taper. Would not recommend antivirals given mild sx. Patient can f/u with PCP, who may refer to outpatient neurology if sx persist. ? ?Su Monks, MD ?[facility] ?[phone] ? ?If 7pm- 7am, please page neurology on call as listed in [place]. ? ?

## 2021-06-10 NOTE — Plan of Care (Signed)
?  Problem: Education: ?Goal: Knowledge of disease or condition will improve ?06/10/2021 1538 by Genevie Ann, RN ?Outcome: Adequate for Discharge ?06/10/2021 1358 by Genevie Ann, RN ?Outcome: Progressing ?Goal: Knowledge of secondary prevention will improve (SELECT ALL) ?06/10/2021 1538 by Genevie Ann, RN ?Outcome: Adequate for Discharge ?06/10/2021 1358 by Genevie Ann, RN ?Outcome: Progressing ?Goal: Knowledge of patient specific risk factors will improve (INDIVIDUALIZE FOR PATIENT) ?Outcome: Adequate for Discharge ?  ?Problem: Health Behavior/Discharge Planning: ?Goal: Ability to manage health-related needs will improve ?Outcome: Adequate for Discharge ?  ?Problem: Self-Care: ?Goal: Ability to participate in self-care as condition permits will improve ?Outcome: Adequate for Discharge ?Goal: Verbalization of feelings and concerns over difficulty with self-care will improve ?Outcome: Adequate for Discharge ?Goal: Ability to communicate needs accurately will improve ?Outcome: Adequate for Discharge ?  ?Problem: Nutrition: ?Goal: Risk of aspiration will decrease ?Outcome: Adequate for Discharge ?Goal: Dietary intake will improve ?Outcome: Adequate for Discharge ?  ?Problem: Intracerebral Hemorrhage Tissue Perfusion: ?Goal: Complications of Intracerebral Hemorrhage will be minimized ?Outcome: Adequate for Discharge ?  ?Problem: Ischemic Stroke/TIA Tissue Perfusion: ?Goal: Complications of ischemic stroke/TIA will be minimized ?Outcome: Adequate for Discharge ?  ?Problem: Spontaneous Subarachnoid Hemorrhage Tissue Perfusion: ?Goal: Complications of Spontaneous Subarachnoid Hemorrhage will be minimized ?Outcome: Adequate for Discharge ?  ?Problem: Education: ?Goal: Knowledge of General Education information will improve ?Description: Including pain rating scale, medication(s)/side effects and non-pharmacologic comfort measures ?Outcome: Adequate for Discharge ?  ?Problem: Health Behavior/Discharge  Planning: ?Goal: Ability to manage health-related needs will improve ?Outcome: Adequate for Discharge ?  ?Problem: Clinical Measurements: ?Goal: Ability to maintain clinical measurements within normal limits will improve ?Outcome: Adequate for Discharge ?Goal: Will remain free from infection ?Outcome: Adequate for Discharge ?Goal: Diagnostic test results will improve ?Outcome: Adequate for Discharge ?Goal: Respiratory complications will improve ?Outcome: Adequate for Discharge ?Goal: Cardiovascular complication will be avoided ?Outcome: Adequate for Discharge ?  ?Problem: Activity: ?Goal: Risk for activity intolerance will decrease ?Outcome: Adequate for Discharge ?  ?Problem: Nutrition: ?Goal: Adequate nutrition will be maintained ?Outcome: Adequate for Discharge ?  ?Problem: Coping: ?Goal: Level of anxiety will decrease ?Outcome: Adequate for Discharge ?  ?Problem: Elimination: ?Goal: Will not experience complications related to bowel motility ?Outcome: Adequate for Discharge ?Goal: Will not experience complications related to urinary retention ?Outcome: Adequate for Discharge ?  ?Problem: Pain Managment: ?Goal: General experience of comfort will improve ?Outcome: Adequate for Discharge ?  ?Problem: Safety: ?Goal: Ability to remain free from injury will improve ?Outcome: Adequate for Discharge ?  ?Problem: Skin Integrity: ?Goal: Risk for impaired skin integrity will decrease ?Outcome: Adequate for Discharge ?  ?

## 2021-06-13 LAB — ACETYLCHOLINE RECEPTOR AB, ALL
Acety choline binding ab: <0.03 nmol/L (ref 0.00–0.24)
Acetylchol Block Ab: 14 % (ref 0–25)
Acetylcholine Modulat Ab: 0 % (ref 0–45)

## 2021-08-19 ENCOUNTER — Encounter: Payer: Self-pay | Admitting: Neurology

## 2021-08-19 ENCOUNTER — Ambulatory Visit: Payer: Medicaid Other | Admitting: Neurology

## 2021-08-19 ENCOUNTER — Telehealth: Payer: Self-pay | Admitting: Neurology

## 2021-08-19 VITALS — BP 109/73 | HR 86 | Ht 60.0 in | Wt 245.5 lb

## 2021-08-19 DIAGNOSIS — G4733 Obstructive sleep apnea (adult) (pediatric): Secondary | ICD-10-CM | POA: Insufficient documentation

## 2021-08-19 DIAGNOSIS — H4710 Unspecified papilledema: Secondary | ICD-10-CM | POA: Insufficient documentation

## 2021-08-19 DIAGNOSIS — G43709 Chronic migraine without aura, not intractable, without status migrainosus: Secondary | ICD-10-CM | POA: Diagnosis not present

## 2021-08-19 MED ORDER — TOPIRAMATE 100 MG PO TABS: 100.0000 mg | ORAL_TABLET | Freq: Two times a day (BID) | ORAL | 11 refills | Status: DC

## 2021-08-19 MED ORDER — SUMATRIPTAN SUCCINATE 50 MG PO TABS: 50.0000 mg | ORAL_TABLET | ORAL | 11 refills | Status: DC | PRN

## 2021-08-19 NOTE — Progress Notes (Addendum)
Chief Complaint  Patient presents with   New Patient (Initial Visit)    Rm 14. Accompanied by mother. NP/Paper/Susan Ziglar MD [facility]/Bells palsy and headaches.      ASSESSMENT AND PLAN  Veronica Harding is a 56 y.o. female   Chronic migraine Obesity Possible bilateral papillary edema Obstructive sleep apnea, last sleep study was more than 3 years ago, previously could not tolerate CPAP,  MRI of the brain with without contrast was normal,  MRA of neck with without contrast showed no large vessel disease  Topamax 100 titrating to 200 mg daily as preventive medications  Imitrex as needed for migraine abortive treatment  Refer to ophthalmologist for evaluation  She is willing to go through sleep reevaluation, referred for sleep study  DIAGNOSTIC DATA (LABS, IMAGING, TESTING) - I reviewed patient records, labs, notes, testing and imaging myself where available. Addendum: Ophthalmology evaluation by Dr. Lyn Recordsobert Grote October 01, 2021, posterior showed no disc edema,  MEDICAL HISTORY:  Veronica Harding, is a 56 year old female seen in request by Dr. Girtha RmZiglar, Eli PhillipsSusan K, [facility], for evaluation of frequent headaches, possible bilateral papillary edema, she is accompanied by her mother at today's visit August 19, 2021  I reviewed and summarized the referring note. PMHX. ADHD Obesity Depression, anxiety Hyothyroidism HTN HLD  She had long history of morbidly obesity, frequent headaches, reported worsening headaches since beginning of 2023, right retro-orbital area moderate to severe headaches, happened 3 to 4 days out of the week, lateralized pounding headache with light noise sensitivity, nauseous, lasting for few hours, she often wake up with headaches, tried Tylenol with limited help, not taking medicine frequently as abortive treatment  She has been combating obesity for many years, lost 40 pounds over the past 1 year, during her most recent yearly checkup with [facility]  optometrist, she was told to have possible bilateral papillary edema, she did notice gradual vision change over the past couple years, especially with close-up visions, denies for vision change, denies difficulty focusing  Hospital admission in March 2023, work-up presenting with left-sided moderate headaches, word finding difficulties, feeling off balance, left facial droopy,  Personally reviewed MRI of the brain with without contrast, no acute abnormality, mild small vessel disease  MRA of the neck and the brain showed no large vessel disease  Echocardiogram normal ejection fraction no significant abnormality  Ultrasound of carotid artery no significant abnormality  Laboratory evaluations, negative HIV, acetylcholine receptor binding antibody, A1c, triglyceride was elevated 179, LDL 108, normal TSH, BMP showed elevated creatinine 1.52, CBC hemoglobin of 11.7  She was diagnosed with obstructive sleep apnea in the past, last sleep study was more than 3 years ago, she sleeps on her stomach, could not tolerate facial mask, now her headache mostly happen in the morning time, also complains of morning dry mouth, loud snoring, is waiting to go through sleep study again, try more aggressive treatment to better control her sleep apnea.  PHYSICAL EXAM:   Vitals:   08/19/21 0813  BP: 109/73  Pulse: 86  Weight: 245 lb 8 oz (111.4 kg)  Height: 5' (1.524 m)    Body mass index is 47.95 kg/m.  PHYSICAL EXAMNIATION:  Gen: NAD, conversant, well nourised, well groomed                     Cardiovascular: Regular rate rhythm, no peripheral edema, warm, nontender. Eyes: Conjunctivae clear without exudates or hemorrhage Neck: Supple, no carotid bruits. Pulmonary: Clear to auscultation bilaterally  NEUROLOGICAL EXAM:  MENTAL STATUS: Speech/cognition: Awake, alert, oriented to history taking and casual conversation, obese CRANIAL NERVES: CN II: Visual fields are full to confrontation. Pupils are  round equal and briskly reactive to light.  Funduscopy examination showed blurry edge at bilateral optic disc CN III, IV, VI: extraocular movement are normal. No ptosis. CN V: Facial sensation is intact to light touch CN VII: Face is symmetric with normal eye closure  CN VIII: Hearing is normal to causal conversation. CN IX, X: Phonation is normal. CN XI: Head turning and shoulder shrug are intact  MOTOR: There is no pronator drift of out-stretched arms. Muscle bulk and tone are normal. Muscle strength is normal.  REFLEXES: Reflexes are 2+ and symmetric at the biceps, triceps, knees, and ankles. Plantar responses are flexor.  SENSORY: Intact to light touch, pinprick and vibratory sensation are intact in fingers and toes.  COORDINATION: There is no trunk or limb dysmetria noted.  GAIT/STANCE: Need to push-up to get up from seated position, steady, limited by her big body habitus  REVIEW OF SYSTEMS:  Full 14 system review of systems performed and notable only for as above All other review of systems were negative.   ALLERGIES: Allergies  Allergen Reactions   Lactase-Lactobacillus     HOME MEDICATIONS: Current Outpatient Medications  Medication Sig Dispense Refill   AMITIZA 8 MCG capsule Take 8 mcg by mouth 2 (two) times daily.     amphetamine-dextroamphetamine (ADDERALL) 5 MG tablet Take 5 mg by mouth 2 (two) times daily with a meal.     aspirin EC 81 MG tablet Take 81 mg by mouth daily. Swallow whole.     buPROPion (WELLBUTRIN XL) 150 MG 24 hr tablet Take 150 mg by mouth daily.     ergocalciferol (VITAMIN D2) 1.25 MG (50000 UT) capsule Take 50,000 Units by mouth once a week.     FLUoxetine (PROZAC) 20 MG tablet Take 20 mg by mouth daily.     levothyroxine (SYNTHROID) 50 MCG tablet Take 50 mcg by mouth daily.     lisinopril (ZESTRIL) 20 MG tablet Take 20 mg by mouth daily.     naltrexone (DEPADE) 50 MG tablet Take 25 mg by mouth daily.     traZODone (DESYREL) 50 MG tablet  Take 50 mg by mouth at bedtime.     atorvastatin (LIPITOR) 40 MG tablet Take 1 tablet (40 mg total) by mouth daily. 30 tablet 0   No current facility-administered medications for this visit.    PAST MEDICAL HISTORY: Past Medical History:  Diagnosis Date   Anxiety and depression    Constipation    Dyslipidemia    GERD (gastroesophageal reflux disease)    Hypertension    Hypothyroidism    Insomnia    Obesity, Class III, BMI 40-49.9 (morbid obesity) (HCC) 06/08/2021   Sleep apnea    does not wear CPAP    PAST SURGICAL HISTORY: Past Surgical History:  Procedure Laterality Date   CHOLECYSTECTOMY     COLONOSCOPY N/A 04/28/2021   Procedure: COLONOSCOPY;  Surgeon: Jaynie Collins, DO;  Location: [facility] ENDOSCOPY;  Service: Gastroenterology;  Laterality: N/A;  DM    FAMILY HISTORY: Family History  Problem Relation Age of Onset   Stroke Mother 70    SOCIAL HISTORY: Social History   Socioeconomic History   Marital status: Divorced    Spouse name: Not on file   Number of children: Not on file   Years of education: Not on file   Highest  education level: Not on file  Occupational History   Occupation: Set designer    Comment: [employer]  Tobacco Use   Smoking status: Never   Smokeless tobacco: Never  Vaping Use   Vaping Use: Never used  Substance and Sexual Activity   Alcohol use: Never   Drug use: Never   Sexual activity: Not on file  Other Topics Concern   Not on file  Social History Narrative   Not on file   Social Determinants of Health   Financial Resource Strain: Not on file  Food Insecurity: Not on file  Transportation Needs: Not on file  Physical Activity: Not on file  Stress: Not on file  Social Connections: Not on file  Intimate Partner Violence: Not on file      Levert Feinstein, M.D. Ph.D.  [facility] [address] [place], [place] [address] Ph: [phone] Fax: [phone]  CC:  Girtha Rm Eli Phillips, MD [address] [place],  [place] [address]  [facility]

## 2021-08-19 NOTE — Telephone Encounter (Signed)
Referral for Ophthalmology sent to [facility] [phone]. 

## 2021-08-20 LAB — VITAMIN B12: Vitamin B-12: 341 pg/mL (ref 232–1245)

## 2021-08-20 LAB — RPR: RPR Ser Ql: NONREACTIVE

## 2021-08-25 ENCOUNTER — Ambulatory Visit: Payer: Medicaid Other | Admitting: Neurology

## 2021-08-26 ENCOUNTER — Telehealth: Payer: Self-pay

## 2021-08-26 NOTE — Telephone Encounter (Signed)
-----   Message from Levert Feinstein, MD sent at 08/26/2021  4:43 PM EDT ----- Please call patient, laboratory evaluation showed no significant abnormalities.

## 2021-08-26 NOTE — Telephone Encounter (Signed)
I called patient. I discussed her lab results. She will call Dr. Zenia Resides office to check the status of that referral. She has not yet been contacted by our sleep lab for the sleep consult but will follow up within a week. Pt verbalized understanding of results. Pt had no questions at this time but was encouraged to call back if questions arise.

## 2021-10-22 ENCOUNTER — Encounter: Payer: Self-pay | Admitting: Neurology

## 2021-10-22 ENCOUNTER — Ambulatory Visit (INDEPENDENT_AMBULATORY_CARE_PROVIDER_SITE_OTHER): Payer: Medicaid Other | Admitting: Neurology

## 2021-10-22 VITALS — BP 118/74 | HR 79 | Ht 60.0 in | Wt 239.2 lb

## 2021-10-22 DIAGNOSIS — G4733 Obstructive sleep apnea (adult) (pediatric): Secondary | ICD-10-CM

## 2021-10-22 DIAGNOSIS — R351 Nocturia: Secondary | ICD-10-CM

## 2021-10-22 DIAGNOSIS — R519 Headache, unspecified: Secondary | ICD-10-CM

## 2021-10-22 DIAGNOSIS — R0681 Apnea, not elsewhere classified: Secondary | ICD-10-CM

## 2021-10-22 DIAGNOSIS — Z6841 Body Mass Index (BMI) 40.0 and over, adult: Secondary | ICD-10-CM

## 2021-10-22 NOTE — Patient Instructions (Signed)

## 2021-10-22 NOTE — Progress Notes (Addendum)
Subjective:    Patient ID: Veronica Harding is a 56 y.o. female.  HPI    Veronica Foley, MD, PhD [facility] [address], [address] [address] [place], [place] [address]  Dear Vivia Ewing,   I saw your patient, Veronica Harding, upon your kind request in my sleep clinic today for initial consultation of her sleep disorder, in particular, concern for underlying obstructive sleep apnea.  The patient is unaccompanied today.  As you know, Veronica Harding is a 56 year old right-handed woman with an underlying medical history of hypertension, hypothyroidism, hyperlipidemia, reflux disease, anxiety, depression, recurrent headaches, including migraines, possible papilledema, and morbid obesity with a BMI of over 45, who reports a prior diagnosis of obstructive sleep apnea and difficulty tolerating PAP therapy at the time.  I reviewed your office note from 08/19/2021.  Her Epworth sleepiness score is 4/24, fatigue severity score is 21 out of 63.  She had sleep testing some 3+ years ago, she had an in lab study, does not recall if it was mild to moderate or severe but did get a CPAP machine and used it for maybe a week, had difficulty tolerating it as she is a stomach sleeper and could not keep the mask on.  She gave the machine back.  She is willing to get retested and consider PAP therapy again if the need arises.  She has had recurrent headaches including occasional morning headaches.  She recently saw ophthalmology and reports that findings were benign.  She will discuss with you further.  She has been able to lose some weight, in fact, she lost about 50 pounds over the past year.  She is now plateaued she feels.  She does have loud snoring and apneas witnessed by family.  She is divorced and does not share her bedroom with anybody but is noted to have pausing in her breathing and snoring even when someone is sleeping next-door.  She has her younger son living with her and her older son's girlfriend  stays with them and older son comes home over the weekend.  He works out of town.  Bedtime is generally around 11 and rise time around 7.  She does not have a TV in her bedroom.  She has 2 indoor cats and 2 outside cats, none of her pets sleep in her bedroom.  Her father has a history of sleep apnea and uses a machine, he also has A-fib.  She had an adenoidectomy.  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  She is a non-smoker and does not drink alcohol and does not drink caffeine daily, occasional tea.  She has nocturia about 2-3 times per average night.  Her Past Medical History Is Significant For: Past Medical History:  Diagnosis Date   Anxiety and depression    Constipation    Dyslipidemia    GERD (gastroesophageal reflux disease)    Hypertension    Hypothyroidism    Insomnia    Obesity, Class III, BMI 40-49.9 (morbid obesity) (HCC) 06/08/2021   Sleep apnea    does not wear CPAP    Her Past Surgical History Is Significant For: Past Surgical History:  Procedure Laterality Date   CHOLECYSTECTOMY     COLONOSCOPY N/A 04/28/2021   Procedure: COLONOSCOPY;  Surgeon: Jaynie Collins, DO;  Location: [facility] ENDOSCOPY;  Service: Gastroenterology;  Laterality: N/A;  DM    Her Family History Is Significant  For: Family History  Problem Relation Age of Onset   Stroke Mother 54    Her Social History Is Significant For: Social History   Socioeconomic History   Marital status: Divorced    Spouse name: Not on file   Number of children: Not on file   Years of education: Not on file   Highest education level: Not on file  Occupational History   Occupation: Psychologist, educational    Comment: [employer]  Tobacco Use   Smoking status: Never   Smokeless tobacco: Never  Vaping Use   Vaping Use: Never used  Substance and Sexual Activity   Alcohol use: Never   Drug use: Never   Sexual activity: Not on  file  Other Topics Concern   Not on file  Social History Narrative   Not on file   Social Determinants of Health   Financial Resource Strain: Not on file  Food Insecurity: Not on file  Transportation Needs: Not on file  Physical Activity: Not on file  Stress: Not on file  Social Connections: Not on file    Her Allergies Are:  Allergies  Allergen Reactions   Lactase-Lactobacillus   :   Her Current Medications Are:  Outpatient Encounter Medications as of 10/22/2021  Medication Sig   AMITIZA 8 MCG capsule Take 8 mcg by mouth 2 (two) times daily.   amphetamine-dextroamphetamine (ADDERALL) 5 MG tablet Take 5 mg by mouth 2 (two) times daily with a meal.   aspirin EC 81 MG tablet Take 81 mg by mouth daily. Swallow whole.   atorvastatin (LIPITOR) 40 MG tablet Take 1 tablet (40 mg total) by mouth daily.   buPROPion (WELLBUTRIN XL) 150 MG 24 hr tablet Take 150 mg by mouth daily.   ergocalciferol (VITAMIN D2) 1.25 MG (50000 UT) capsule Take 50,000 Units by mouth once a week.   FLUoxetine (PROZAC) 20 MG tablet Take 20 mg by mouth daily.   levothyroxine (SYNTHROID) 50 MCG tablet Take 50 mcg by mouth daily.   lisinopril (ZESTRIL) 20 MG tablet Take 20 mg by mouth daily.   naltrexone (DEPADE) 50 MG tablet Take 25 mg by mouth daily.   SUMAtriptan (IMITREX) 50 MG tablet Take 1 tablet (50 mg total) by mouth every 2 (two) hours as needed for migraine. May repeat in 2 hours if headache persists or recurs.   topiramate (TOPAMAX) 100 MG tablet Take 1 tablet (100 mg total) by mouth 2 (two) times daily.   traZODone (DESYREL) 50 MG tablet Take 50 mg by mouth at bedtime.   No facility-administered encounter medications on file as of 10/22/2021.  :   Review of Systems:  Out of a complete 14 point review of systems, all are reviewed and negative with the exception of these symptoms as listed below:   Review of Systems  Neurological:        Pt here for R/O OSA.  Stated has had testing done at  [facility] office ([address]).  Does not recall what level of OSA.  Sleeps prone, face in pillow so does not allow for using cpap masks.  Snoring. ESS 4, FSS  21.    Objective:  Neurological Exam  Physical Exam Physical Examination:   Vitals:   10/22/21 0850  BP: 118/74  Pulse: 79    General Examination: The patient is a very pleasant 56 y.o. female in no acute distress. She appears well-developed and well-nourished and well groomed.   HEENT: Normocephalic, atraumatic, pupils are equal, round and reactive to  light, extraocular tracking is good without limitation to gaze excursion or nystagmus noted. Hearing is grossly intact. Face is symmetric with normal facial animation. Speech is clear with no dysarthria noted. There is no hypophonia. There is no lip, neck/head, jaw or voice tremor. Neck is supple with full range of passive and active motion. There are no carotid bruits on auscultation. Oropharynx exam reveals: mild mouth dryness, good dental hygiene and moderate airway crowding, due to longer uvula, tonsils 1+, left easier to see than right, smaller airway entry. Mallampati is class II. Tongue protrudes centrally and palate elevates symmetrically. 16 5/8 inches. She has a minimal overbite.    Chest: Clear to auscultation without wheezing, rhonchi or crackles noted.  Heart: S1+S2+0, regular and normal without murmurs, rubs or gallops noted.   Abdomen: Soft, non-tender and non-distended.  Extremities: There is no pitting edema in the distal lower extremities bilaterally.   Skin: Warm and dry without trophic changes noted.   Musculoskeletal: exam reveals no obvious joint deformities.   Neurologically:  Mental status: The patient is awake, alert and oriented in all 4 spheres. Her immediate and remote memory, attention, language skills and fund of knowledge are appropriate. There is no evidence of aphasia, agnosia, apraxia or anomia. Speech is clear with normal prosody and  enunciation. Thought process is linear. Mood is normal and affect is normal.  Cranial nerves II - XII are as described above under HEENT exam.  Motor exam: Normal bulk, strength and tone is noted. There is no obvious tremor. Fine motor skills and coordination: grossly intact.  Cerebellar testing: No dysmetria or intention tremor. There is no truncal or gait ataxia.  Sensory exam: intact to light touch in the upper and lower extremities.  Gait, station and balance: She stands easily. No veering to one side is noted. No leaning to one side is noted. Posture is age-appropriate and stance is narrow based. Gait shows normal stride length and normal pace. No problems turning are noted.   Assessment and Plan:  In summary, Veronica Harding is a very pleasant 56 y.o.-year old female with an underlying medical history of hypertension, hypothyroidism, hyperlipidemia, reflux disease, anxiety, depression, recurrent headaches, including migraines, possible papilledema, and morbid obesity with a BMI of over 45, whose history and physical exam are concerning for sleep disordered breathing, supporting a current working diagnosis of unspecified sleep apnea, with the main differential diagnoses of obstructive sleep apnea (OSA) versus upper airway resistance syndrome (UARS) versus central sleep apnea (CSA), or mixed sleep apnea. A laboratory attended sleep study is considered gold standard for evaluation of sleep disordered breathing and is recommended at this time and clinically justified.   I had a long chat with the patient about my findings and the diagnosis of sleep apnea, particularly OSA, its prognosis and treatment options. We talked about medical/conservative treatments, surgical interventions and non-pharmacological approaches for symptom control. I explained, in particular, the risks and ramifications of untreated moderate to severe OSA, especially with respect to developing cardiovascular disease down the road,  including congestive heart failure (CHF), difficult to treat hypertension, cardiac arrhythmias (particularly A-fib), neurovascular complications including TIA, stroke and dementia. Even type 2 diabetes has, in part, been linked to untreated OSA. Symptoms of untreated OSA may include (but may not be limited to) daytime sleepiness, nocturia (i.e. frequent nighttime urination), memory problems, mood irritability and suboptimally controlled or worsening mood disorder such as depression and/or anxiety, lack of energy, lack of motivation, physical discomfort, as well as recurrent headaches,  especially morning or nocturnal headaches. We talked about the importance of maintaining a healthy lifestyle and striving for healthy weight. In addition, we talked about the importance of striving for and maintaining good sleep hygiene. I recommended the following at this time: sleep study.  I outlined the differences between a laboratory attended sleep study which is considered more comprehensive and accurate over the option of a home sleep test (HST); the latter may lead to underestimation of sleep disordered breathing in some instances and does not help with diagnosing upper airway resistance syndrome and is not accurate enough to diagnose primary central sleep apnea typically. I explained the different sleep test procedures to the patient in detail and also outlined possible surgical and non-surgical treatment options of OSA, including the use of a pressure airway pressure (PAP) device (ie CPAP, AutoPAP/APAP or BiPAP in certain circumstances), a custom-made dental device (aka oral appliance, which would require a referral to a specialist dentist or orthodontist typically, and is generally speaking not considered a good choice for patients with full dentures or edentulous state), upper airway surgical options, such as traditional UPPP (which is not considered a first-line treatment) or the Inspire device (hypoglossal nerve  stimulator, which would involve a referral for consultation with an ENT surgeon, after careful selection, following inclusion criteria). I explained the PAP treatment option to the patient in detail, as this is generally considered first-line treatment.  The patient indicated that she would be willing to try PAP therapy, if the need arises. I explained the importance of being compliant with PAP treatment, not only for insurance purposes but primarily to improve patient's symptoms symptoms, and for the patient's long term health benefit, including to reduce Her cardiovascular risks longer-term.    We will pick up our discussion about the next steps and treatment options after testing.  We will keep her posted as to the test results by phone call and/or MyChart messaging where possible.  We will plan to follow-up in sleep clinic accordingly as well.  I answered all her questions today and the patient was in agreement.   I encouraged her to call with any interim questions, concerns, problems or updates or email [place] through [place].  Generally speaking, sleep test authorizations may take up to 2 weeks, sometimes less, sometimes longer, the patient is encouraged to get in touch with [place] if they do not hear back from the sleep lab staff directly within the next 2 weeks.  Thank you very much for allowing me to participate in the care of this nice patient. If I can be of any further assistance to you please do not hesitate to talk to me.  Sincerely,   Star Age, MD, PhD

## 2021-11-03 ENCOUNTER — Telehealth: Payer: Self-pay | Admitting: Neurology

## 2021-11-03 NOTE — Telephone Encounter (Signed)
[facility] pending uploaded notes on the portal 

## 2021-11-05 NOTE — Telephone Encounter (Signed)
Checked status on the portal it is still pending.  

## 2021-11-20 NOTE — Telephone Encounter (Signed)
[facility] denied the NPSG.  HST- [facility] no auth req.  Patient is scheduled at [facility] for 12/16/21 at 8 AM.  Mailed packet to the patient.

## 2021-12-16 ENCOUNTER — Ambulatory Visit: Payer: Medicaid Other | Admitting: Neurology

## 2021-12-16 DIAGNOSIS — G4733 Obstructive sleep apnea (adult) (pediatric): Secondary | ICD-10-CM | POA: Diagnosis not present

## 2021-12-16 DIAGNOSIS — R519 Headache, unspecified: Secondary | ICD-10-CM

## 2021-12-16 DIAGNOSIS — R0681 Apnea, not elsewhere classified: Secondary | ICD-10-CM

## 2021-12-16 DIAGNOSIS — R351 Nocturia: Secondary | ICD-10-CM

## 2021-12-18 NOTE — Progress Notes (Signed)
See procedure note.

## 2021-12-22 NOTE — Procedures (Signed)
[facility]  HOME SLEEP TEST (Watch PAT) REPORT  STUDY DATE: 12/16/2021  DOB: December 26, 1965  MRN: [id number]  ORDERING CLINICIAN: Star Age, MD, PhD   REFERRING CLINICIAN: Dr. Krista Blue  CLINICAL INFORMATION/HISTORY: 56 year old female with a history of hypertension, hypothyroidism, hyperlipidemia, reflux disease, anxiety, depression, recurrent headaches, including migraines, possible papilledema, and morbid obesity with a BMI of over 61, who reports a prior diagnosis of obstructive sleep apnea and difficulty tolerating PAP therapy at the time.  She presents for reevaluation for ongoing concern for sleep apnea.   Epworth sleepiness score: 4/24.  BMI: 47.2 kg/m  FINDINGS:   Sleep Summary:   Total Recording Time (hours, min): 8 hours, 12 min  Total Sleep Time (hours, min):  6 hours, 49 min  Percent REM (%):    17.4%   Respiratory Indices:   Calculated pAHI (per hour):  6.9/hour         REM pAHI:    6.7/hour       NREM pAHI: 6.9/hour  Central pAHI: 0/hour  Oxygen Saturation Statistics:    Oxygen Saturation (%) Mean: 94%   Minimum oxygen saturation (%):                 89%   O2 Saturation Range (%): 89-99%    O2 Saturation (minutes) <=88%: 0 min  Pulse Rate Statistics:   Pulse Mean (bpm):    75/min    Pulse Range (59-96/min)   IMPRESSION: OSA (obstructive sleep apnea), mild  RECOMMENDATION:  This home sleep test demonstrates overall mild obstructive sleep apnea with a total AHI of 6.9/hour and O2 nadir of 89%. Snoring was detected, and appeared to be mild to moderate, intermittent. Given the patient's medical history and sleep related complaints, therapy with a  positive airway pressure device is a reasonable first-line choice and clinically recommended. Treatment can be achieved in the form of autoPAP trial/titration at home for now. A full night, in-lab PAP titration study may aid in improving proper treatment settings and with mask fit, if  needed, down the road. Alternative treatments may include weight loss (where appropriate) along with avoidance of the supine sleep position (if possible), or an oral appliance in appropriate candidates.   Please note that untreated obstructive sleep apnea may carry additional perioperative morbidity. Patients with significant obstructive sleep apnea should receive perioperative PAP therapy and the surgeons and particularly the anesthesiologist should be informed of the diagnosis and the severity of the sleep disordered breathing. The patient should be cautioned not to drive, work at heights, or operate dangerous or heavy equipment when tired or sleepy. Review and reiteration of good sleep hygiene measures should be pursued with any patient. Other causes of the patient's symptoms, including circadian rhythm disturbances, an underlying mood disorder, medication effect and/or an underlying medical problem cannot be ruled out based on this test. Clinical correlation is recommended.  The patient and her referring provider will be notified of the test results. The patient will be seen in follow up in sleep clinic at [facility], as necessary.  I certify that I have reviewed the raw data recording prior to the issuance of this report in accordance with the standards of the American Academy of Sleep Medicine (AASM).    INTERPRETING PHYSICIAN:   Star Age, MD, PhD  Board Certified in Neurology and Sleep Medicine  [facility] [address], [place], [place] [address] [phone]

## 2021-12-22 NOTE — Addendum Note (Signed)
Addended by: Star Age on: 12/22/2021 05:42 PM   Modules accepted: Orders

## 2021-12-23 DIAGNOSIS — N182 Chronic kidney disease, stage 2 (mild): Secondary | ICD-10-CM | POA: Insufficient documentation

## 2021-12-23 DIAGNOSIS — I10 Essential (primary) hypertension: Secondary | ICD-10-CM | POA: Insufficient documentation

## 2021-12-24 ENCOUNTER — Encounter: Payer: Self-pay | Admitting: *Deleted

## 2021-12-24 ENCOUNTER — Telehealth: Payer: Self-pay | Admitting: *Deleted

## 2021-12-24 NOTE — Telephone Encounter (Signed)
-----   Message from Star Age, MD sent at 12/22/2021  5:42 PM EDT ----- Patient referred by Dr. Krista Blue for reevaluation of her sleep apnea diagnosis, she was diagnosed a few years ago but could not tolerate CPAP or AutoPap in the past.  She presented for reevaluation with a home sleep test on 12/16/2021.  Please call and notify the patient that the recent home sleep test showed obstructive sleep apnea. OSA is overall mild, but worth treating to see if she feels better after treatment. To that end I recommend treatment for this in the form of autoPAP, which means, that we don't have to bring her in for a sleep study with CPAP, but will let her try an autoPAP machine at home, through a DME company (of her choice, or as per insurance requirement). The DME representative will educate her on how to use the machine, how to put the mask on, etc. I have placed an order in the chart. Please send referral, talk to patient, send report to referring MD. We will need a FU in sleep clinic for 10 weeks post-PAP set up, please arrange that with me or one of our NPs. Thanks,   Star Age, MD, PhD [facility])

## 2021-12-24 NOTE — Telephone Encounter (Signed)
Spoke to pt and gave her results of HST. Mild OSA.  Recommend cpap (to see if makes her feel better).  Pt hesitant at first.  I relayed that if OSA recommend treatment to prevent issues with stroke, heart disease, lung problems, memory, headaches.  She said will try, she will go to [facility].  I reviewed PAP compliance expectations with the pt. Pt is agreeable to starting an auto-PAP. I advised pt that an order will be sent to a DME, [facility], and they will call the pt within about one week after they file with the pt's insurance. They will show the pt how to use the machine, fit for masks, and troubleshoot the auto-PAP if needed. A follow up appt was made for insurance purposes with [place] on Mar 03, 2022 at 12-28-2021. Pt verbalized understanding to arrive 15 minutes early and bring their auto-PAP. A letter with all of this information in it will be mailed to the pt as a reminder. I verified with the pt that the address we have on file is correct. Pt verbalized understanding of results. Pt had no questions at this time but was encouraged to call back if questions arise. I have sent the order to [facility] and have received confirmation that they have received the order.

## 2022-03-02 ENCOUNTER — Telehealth: Payer: Self-pay | Admitting: Neurology

## 2022-03-02 NOTE — Progress Notes (Deleted)
[facility] [address] [place]. [place] [address] [phone]       OFFICE FOLLOW UP NOTE  Veronica Harding Date of Birth:  24-Dec-1965 Medical Record Number:  [id number]   Reason for visit: Initial CPAP follow-up    SUBJECTIVE:   CHIEF COMPLAINT:  No chief complaint on file.   HPI:   Update 03/03/2022 JM: Patient returns for initial CPAP compliance visit.  Completed sleep study 12/16/2021 which showed overall mild OSA with total AHI of 6.9/h and O2 nadir of 89%.  CPAP initiated 01/29/2022.       History provided for reference purposes only Consult visit 10/22/2021 Dr. Frances Furbish: Veronica Harding is a 56 year old right-handed woman with an underlying medical history of hypertension, hypothyroidism, hyperlipidemia, reflux disease, anxiety, depression, recurrent headaches, including migraines, possible papilledema, and morbid obesity with a BMI of over 45, who reports a prior diagnosis of obstructive sleep apnea and difficulty tolerating PAP therapy at the time.  I reviewed your office note from 08/19/2021.  Her Epworth sleepiness score is 4/24, fatigue severity score is 21 out of 63.  She had sleep testing some 3+ years ago, she had an in lab study, does not recall if it was mild to moderate or severe but did get a CPAP machine and used it for maybe a week, had difficulty tolerating it as she is a stomach sleeper and could not keep the mask on.  She gave the machine back.  She is willing to get retested and consider PAP therapy again if the need arises.  She has had recurrent headaches including occasional morning headaches.  She recently saw ophthalmology and reports that findings were benign.  She will discuss with you further.  She has been able to lose some weight, in fact, she lost about 50 pounds over the past year.  She is now plateaued she feels.  She does have loud snoring and apneas witnessed by family.  She is divorced and does not share her bedroom with anybody  but is noted to have pausing in her breathing and snoring even when someone is sleeping next-door.  She has her younger son living with her and her older son's girlfriend stays with them and older son comes home over the weekend.  He works out of town.  Bedtime is generally around 11 and rise time around 7.  She does not have a TV in her bedroom.  She has 2 indoor cats and 2 outside cats, none of her pets sleep in her bedroom.  Her father has a history of sleep apnea and uses a machine, he also has A-fib.  She had an adenoidectomy.  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  She is a non-smoker and does not drink alcohol and does not drink caffeine daily, occasional tea.  She has nocturia about 2-3 times per average night.       ROS:   14 system review of systems performed and negative with exception of those listed in HPI  PMH:  Past Medical History:  Diagnosis Date   Anxiety and depression    Constipation    Dyslipidemia    GERD (gastroesophageal reflux disease)    Hypertension    Hypothyroidism    Insomnia    Obesity, Class III, BMI 40-49.9 (morbid obesity) (HCC) 06/08/2021   Sleep apnea    does not wear CPAP    PSH:  Past  Surgical History:  Procedure Laterality Date   CHOLECYSTECTOMY     COLONOSCOPY N/A 04/28/2021   Procedure: COLONOSCOPY;  Surgeon: Jaynie Collins, DO;  Location: [facility] ENDOSCOPY;  Service: Gastroenterology;  Laterality: N/A;  DM    Social History:  Social History   Socioeconomic History   Marital status: Divorced    Spouse name: Not on file   Number of children: Not on file   Years of education: Not on file   Highest education level: Not on file  Occupational History   Occupation: Set designer    Comment: [employer]  Tobacco Use   Smoking status: Never   Smokeless tobacco: Never  Vaping Use   Vaping Use: Never used  Substance and Sexual Activity    Alcohol use: Never   Drug use: Never   Sexual activity: Not on file  Other Topics Concern   Not on file  Social History Narrative   Not on file   Social Determinants of Health   Financial Resource Strain: Not on file  Food Insecurity: Not on file  Transportation Needs: Not on file  Physical Activity: Not on file  Stress: Not on file  Social Connections: Not on file  Intimate Partner Violence: Not on file    Family History:  Family History  Problem Relation Age of Onset   Stroke Mother 21    Medications:   Current Outpatient Medications on File Prior to Visit  Medication Sig Dispense Refill   AMITIZA 8 MCG capsule Take 8 mcg by mouth 2 (two) times daily.     amphetamine-dextroamphetamine (ADDERALL) 5 MG tablet Take 5 mg by mouth 2 (two) times daily with a meal.     aspirin EC 81 MG tablet Take 81 mg by mouth daily. Swallow whole.     atorvastatin (LIPITOR) 40 MG tablet Take 1 tablet (40 mg total) by mouth daily. 30 tablet 0   buPROPion (WELLBUTRIN XL) 150 MG 24 hr tablet Take 150 mg by mouth daily.     ergocalciferol (VITAMIN D2) 1.25 MG (50000 UT) capsule Take 50,000 Units by mouth once a week.     FLUoxetine (PROZAC) 20 MG tablet Take 20 mg by mouth daily.     levothyroxine (SYNTHROID) 50 MCG tablet Take 50 mcg by mouth daily.     lisinopril (ZESTRIL) 20 MG tablet Take 20 mg by mouth daily.     naltrexone (DEPADE) 50 MG tablet Take 25 mg by mouth daily.     SUMAtriptan (IMITREX) 50 MG tablet Take 1 tablet (50 mg total) by mouth every 2 (two) hours as needed for migraine. May repeat in 2 hours if headache persists or recurs. 12 tablet 11   topiramate (TOPAMAX) 100 MG tablet Take 1 tablet (100 mg total) by mouth 2 (two) times daily. 60 tablet 11   traZODone (DESYREL) 50 MG tablet Take 50 mg by mouth at bedtime.     No current facility-administered medications on file prior to visit.    Allergies:   Allergies  Allergen Reactions   Lactase-Lactobacillus        OBJECTIVE:  Physical Exam  There were no vitals filed for this visit. There is no height or weight on file to calculate BMI. No results found.   General: well developed, well nourished, seated, in no evident distress Head: head normocephalic and atraumatic.   Neck: supple with no carotid or supraclavicular bruits Cardiovascular: regular rate and rhythm, no murmurs Musculoskeletal: no deformity Skin:  no rash/petichiae Vascular:  Normal pulses  all extremities   Neurologic Exam Mental Status: Awake and fully alert. Oriented to place and time. Recent and remote memory intact. Attention span, concentration and fund of knowledge appropriate. Mood and affect appropriate.  Cranial Nerves: Pupils equal, briskly reactive to light. Extraocular movements full without nystagmus. Visual fields full to confrontation. Hearing intact. Facial sensation intact. Face, tongue, palate moves normally and symmetrically.  Motor: Normal bulk and tone. Normal strength in all tested extremity muscles Sensory.: intact to touch , pinprick , position and vibratory sensation.  Coordination: Rapid alternating movements normal in all extremities. Finger-to-nose and heel-to-shin performed accurately bilaterally. Gait and Station: Arises from chair without difficulty. Stance is normal. Gait demonstrates normal stride length and balance without use of AD. Tandem walk and heel toe without difficulty.  Reflexes: 1+ and symmetric. Toes downgoing.         ASSESSMENT/PLAN: Veronica Harding is a 56 y.o. year old female    OSA on CPAP : Compliance report shows satisfactory usage with optimal residual AHI.  Discussed continued nightly usage with ensuring greater than 4 hours nightly for optimal benefit and per insurance purposes.  Continue to follow with DME company for any needed supplies or CPAP related concerns     Follow up in *** or call earlier if needed   CC:  PCP: [facility]    I spent  *** minutes of face-to-face and non-face-to-face time with patient.  This included previsit chart review, lab review, study review, order entry, electronic health record documentation, patient education regarding diagnosis of sleep apnea with review and discussion of compliance report and answered all other questions to patient's satisfaction   Ihor Austin, [facility]  [facility] [address] [place], [place] [address]  Phone [phone] Fax [phone] Note: This document was prepared with digital dictation and possible smart phrase technology. Any transcriptional errors that result from this process are unintentional.

## 2022-03-02 NOTE — Telephone Encounter (Signed)
Called the pt to advise that we looked ahead at schedule for tomorrow and noted that she hadn't quite met compliance over 4 hrs of use each night. Advised the patient that we can push this apt out to allow for her to become more compliant. I was able to reschedule for jan 24 at 1:15 am with check in at 12:45 pm.Pt verbalized understanding. Pt had no questions at this time but was encouraged to call back if questions arise.

## 2022-03-03 ENCOUNTER — Encounter: Payer: Medicaid Other | Admitting: Adult Health

## 2022-04-07 NOTE — Progress Notes (Unsigned)
[facility] [address] [place]. [place] [address] [phone]       OFFICE FOLLOW UP NOTE  Ms. PRINCES FINGER Date of Birth:  1965/05/10 Medical Record Number:  [id number]   Reason for visit: Initial CPAP follow-up    SUBJECTIVE:   CHIEF COMPLAINT:  No chief complaint on file.   HPI:   Update 04/08/2022 JM: Patient returns for initial CPAP compliance visit.  Completed HST 12/16/2021 which showed overall mild OSA with total AHI of 6.9/h.  Given patient medical history and sleep-related complaints, recommended initiation of AutoPap which was started on 01/29/2022.        Consult visit 10/22/2021 Dr. Rexene Alberts:  Ms. Dura is a 57 year old right-handed woman with an underlying medical history of hypertension, hypothyroidism, hyperlipidemia, reflux disease, anxiety, depression, recurrent headaches, including migraines, possible papilledema, and morbid obesity with a BMI of over 62, who reports a prior diagnosis of obstructive sleep apnea and difficulty tolerating PAP therapy at the time.  I reviewed your office note from 08/19/2021.  Her Epworth sleepiness score is 4/24, fatigue severity score is 21 out of 63.  She had sleep testing some 3+ years ago, she had an in lab study, does not recall if it was mild to moderate or severe but did get a CPAP machine and used it for maybe a week, had difficulty tolerating it as she is a stomach sleeper and could not keep the mask on.  She gave the machine back.  She is willing to get retested and consider PAP therapy again if the need arises.  She has had recurrent headaches including occasional morning headaches.  She recently saw ophthalmology and reports that findings were benign.  She will discuss with you further.  She has been able to lose some weight, in fact, she lost about 50 pounds over the past year.  She is now plateaued she feels.  She does have loud snoring and apneas witnessed by family.  She is divorced and does not  share her bedroom with anybody but is noted to have pausing in her breathing and snoring even when someone is sleeping next-door.  She has her younger son living with her and her older son's girlfriend stays with them and older son comes home over the weekend.  He works out of town.  Bedtime is generally around 11 and rise time around 7.  She does not have a TV in her bedroom.  She has 2 indoor cats and 2 outside cats, none of her pets sleep in her bedroom.  Her father has a history of sleep apnea and uses a machine, he also has A-fib.  She had an adenoidectomy.  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  She is a non-smoker and does not drink alcohol and does not drink caffeine daily, occasional tea.  She has nocturia about 2-3 times per average night.       ROS:   14 system review of systems performed and negative with exception of those listed in HPI  PMH:  Past Medical History:  Diagnosis Date   Anxiety and depression    Constipation    Dyslipidemia    GERD (gastroesophageal reflux disease)    Hypertension    Hypothyroidism    Insomnia    Obesity, Class III, BMI 40-49.9 (morbid obesity) ([place]) 06/08/2021   Sleep apnea    does not wear CPAP  PSH:  Past Surgical History:  Procedure Laterality Date   CHOLECYSTECTOMY     COLONOSCOPY N/A 04/28/2021   Procedure: COLONOSCOPY;  Surgeon: Jaynie Collins, DO;  Location: [facility] ENDOSCOPY;  Service: Gastroenterology;  Laterality: N/A;  DM    Social History:  Social History   Socioeconomic History   Marital status: Divorced    Spouse name: Not on file   Number of children: Not on file   Years of education: Not on file   Highest education level: Not on file  Occupational History   Occupation: Set designer    Comment: [employer]  Tobacco Use   Smoking status: Never   Smokeless tobacco: Never  Vaping Use   Vaping Use: Never used   Substance and Sexual Activity   Alcohol use: Never   Drug use: Never   Sexual activity: Not on file  Other Topics Concern   Not on file  Social History Narrative   Not on file   Social Determinants of Health   Financial Resource Strain: Not on file  Food Insecurity: Not on file  Transportation Needs: Not on file  Physical Activity: Not on file  Stress: Not on file  Social Connections: Not on file  Intimate Partner Violence: Not on file    Family History:  Family History  Problem Relation Age of Onset   Stroke Mother 69    Medications:   Current Outpatient Medications on File Prior to Visit  Medication Sig Dispense Refill   AMITIZA 8 MCG capsule Take 8 mcg by mouth 2 (two) times daily.     amphetamine-dextroamphetamine (ADDERALL) 5 MG tablet Take 5 mg by mouth 2 (two) times daily with a meal.     aspirin EC 81 MG tablet Take 81 mg by mouth daily. Swallow whole.     atorvastatin (LIPITOR) 40 MG tablet Take 1 tablet (40 mg total) by mouth daily. 30 tablet 0   buPROPion (WELLBUTRIN XL) 150 MG 24 hr tablet Take 150 mg by mouth daily.     ergocalciferol (VITAMIN D2) 1.25 MG (50000 UT) capsule Take 50,000 Units by mouth once a week.     FLUoxetine (PROZAC) 20 MG tablet Take 20 mg by mouth daily.     levothyroxine (SYNTHROID) 50 MCG tablet Take 50 mcg by mouth daily.     lisinopril (ZESTRIL) 20 MG tablet Take 20 mg by mouth daily.     naltrexone (DEPADE) 50 MG tablet Take 25 mg by mouth daily.     SUMAtriptan (IMITREX) 50 MG tablet Take 1 tablet (50 mg total) by mouth every 2 (two) hours as needed for migraine. May repeat in 2 hours if headache persists or recurs. 12 tablet 11   topiramate (TOPAMAX) 100 MG tablet Take 1 tablet (100 mg total) by mouth 2 (two) times daily. 60 tablet 11   traZODone (DESYREL) 50 MG tablet Take 50 mg by mouth at bedtime.     No current facility-administered medications on file prior to visit.    Allergies:   Allergies  Allergen Reactions    Lactase-Lactobacillus       OBJECTIVE:  Physical Exam  There were no vitals filed for this visit. There is no height or weight on file to calculate BMI. No results found.   General: well developed, well nourished, seated, in no evident distress Head: head normocephalic and atraumatic.   Neck: supple with no carotid or supraclavicular bruits Cardiovascular: regular rate and rhythm, no murmurs Musculoskeletal: no deformity Skin:  no rash/petichiae Vascular:  Normal pulses all extremities   Neurologic Exam Mental Status: Awake and fully alert. Oriented to place and time. Recent and remote memory intact. Attention span, concentration and fund of knowledge appropriate. Mood and affect appropriate.  Cranial Nerves: Pupils equal, briskly reactive to light. Extraocular movements full without nystagmus. Visual fields full to confrontation. Hearing intact. Facial sensation intact. Face, tongue, palate moves normally and symmetrically.  Motor: Normal bulk and tone. Normal strength in all tested extremity muscles Sensory.: intact to touch , pinprick , position and vibratory sensation.  Coordination: Rapid alternating movements normal in all extremities. Finger-to-nose and heel-to-shin performed accurately bilaterally. Gait and Station: Arises from chair without difficulty. Stance is normal. Gait demonstrates normal stride length and balance without use of AD. Tandem walk and heel toe without difficulty.  Reflexes: 1+ and symmetric. Toes downgoing.         ASSESSMENT/PLAN: Veronica Harding is a 57 y.o. year old female    OSA on CPAP : Compliance report shows satisfactory usage with optimal residual AHI.  Discussed continued nightly usage with ensuring greater than 4 hours nightly for optimal benefit and per insurance purposes.  Continue to follow with DME company for any needed supplies or CPAP related concerns     Follow up in *** or call earlier if needed   CC:  PCP: [place] spent *** minutes of face-to-face and non-face-to-face time with patient.  This included previsit chart review, lab review, study review, order entry, electronic health record documentation, patient education regarding diagnosis of sleep apnea with review and discussion of compliance report and answered all other questions to patient's satisfaction   Frann Rider, [facility]  [facility] [address] [place], [place] [address]  Phone [phone] Fax [phone] Note: This document was prepared with digital dictation and possible smart phrase technology. Any transcriptional errors that result from this process are unintentional.

## 2022-04-08 ENCOUNTER — Encounter: Payer: Self-pay | Admitting: Adult Health

## 2022-04-08 ENCOUNTER — Ambulatory Visit: Payer: Medicaid Other | Admitting: Adult Health

## 2022-04-08 VITALS — BP 137/74 | HR 81 | Ht 60.0 in | Wt 257.0 lb

## 2022-04-08 DIAGNOSIS — G4733 Obstructive sleep apnea (adult) (pediatric): Secondary | ICD-10-CM | POA: Diagnosis not present

## 2022-05-09 ENCOUNTER — Other Ambulatory Visit: Payer: Self-pay

## 2022-05-09 ENCOUNTER — Emergency Department
Admission: EM | Admit: 2022-05-09 | Discharge: 2022-05-09 | Disposition: A | Discharge status: Home / Self Care | Payer: Medicaid Other | Attending: Emergency Medicine | Admitting: Emergency Medicine

## 2022-05-09 DIAGNOSIS — H9201 Otalgia, right ear: Secondary | ICD-10-CM | POA: Diagnosis not present

## 2022-05-09 DIAGNOSIS — R21 Rash and other nonspecific skin eruption: Secondary | ICD-10-CM | POA: Insufficient documentation

## 2022-05-09 MED ORDER — CIPROFLOXACIN HCL 500 MG PO TABS
750.0000 mg | ORAL_TABLET | Freq: Once | ORAL | Status: AC
  Administered 2022-05-09: 750 mg via ORAL
  Filled 2022-05-09: qty 2

## 2022-05-09 MED ORDER — DIPHENHYDRAMINE HCL 25 MG PO CAPS
25.0000 mg | ORAL_CAPSULE | Freq: Once | ORAL | Status: AC
  Administered 2022-05-09: 25 mg via ORAL
  Filled 2022-05-09: qty 1

## 2022-05-09 MED ORDER — FAMOTIDINE 20 MG PO TABS: 40.0000 mg | ORAL_TABLET | Freq: Every day | ORAL | 1 refills | Status: AC

## 2022-05-09 MED ORDER — FAMOTIDINE 20 MG PO TABS
40.0000 mg | ORAL_TABLET | Freq: Once | ORAL | Status: AC
  Administered 2022-05-09: 40 mg via ORAL
  Filled 2022-05-09: qty 2

## 2022-05-09 MED ORDER — DIPHENHYDRAMINE HCL 25 MG PO CAPS: 25.0000 mg | ORAL_CAPSULE | Freq: Four times a day (QID) | ORAL | 0 refills | Status: DC | PRN

## 2022-05-09 MED ORDER — CIPROFLOXACIN HCL 750 MG PO TABS
750.0000 mg | ORAL_TABLET | Freq: Two times a day (BID) | ORAL | 0 refills | Status: AC
Start: 2022-05-09 — End: 2022-05-16

## 2022-05-09 NOTE — ED Provider Notes (Signed)
[facility] Provider Note  Patient Contact: 6:27 PM (approximate)   History   Facial Swelling   HPI  Veronica Harding is a 57 y.o. female presents to the emergency department with swelling of the right ear along with cartilage with pruritus.  Patient states that she has been itching at the affected area.  No purulent drainage.  No fever or chills.  She denies experiencing similar symptoms in the past.      Physical Exam   Triage Vital Signs: ED Triage Vitals [05/09/22 1650]  Enc Vitals Group     BP (!) 155/97     Pulse Rate 84     Resp 18     Temp 97.7 F (36.5 C)     Temp Source Oral     SpO2 99 %     Weight      Height      Head Circumference      Peak Flow      Pain Score 0     Pain Loc      Pain Edu?      Excl. in [place]?     Most recent vital signs: Vitals:   05/09/22 1650  BP: (!) 155/97  Pulse: 84  Resp: 18  Temp: 97.7 F (36.5 C)  SpO2: 99%     General: Alert and in no acute distress. Eyes:  PERRL. EOMI. Head: No acute traumatic findings ENT:      Nose: No congestion/rhinnorhea.      Mouth/Throat: Mucous membranes are moist. Neck: No stridor. No cervical spine tenderness to palpation. Cardiovascular:  Good peripheral perfusion Respiratory: Normal respiratory effort without tachypnea or retractions. Lungs CTAB. Good air entry to the bases with no decreased or absent breath sounds. Gastrointestinal: Bowel sounds 4 quadrants. Soft and nontender to palpation. No guarding or rigidity. No palpable masses. No distention. No CVA tenderness. Musculoskeletal: Full range of motion to all extremities.  Neurologic:  No gross focal neurologic deficits are appreciated.  Skin: Patient has erythema and edema of the cartilage of the right ear which extends to preauricular skin.   ED Results / Procedures / Treatments   Labs (all labs ordered are listed, but only abnormal results are displayed) Labs Reviewed - No data to  display     PROCEDURES:  Critical Care performed: No  Procedures   MEDICATIONS ORDERED IN ED: Medications  diphenhydrAMINE (BENADRYL) capsule 25 mg (has no administration in time range)  famotidine (PEPCID) tablet 40 mg (has no administration in time range)  ciprofloxacin (CIPRO) tablet 750 mg (has no administration in time range)     IMPRESSION / MDM / ASSESSMENT AND PLAN / ED COURSE  I reviewed the triage vital signs and the nursing notes.                              Assessment and plan Right ear pain 57 year old female presents to the emergency department with right-sided otalgia and erythema of the right cartilage.  Some regions of rash to have a hive-like appearance.  Differential diagnosis includes perichondritis versus allergic reaction.  Will cover patient for both with ciprofloxacin twice daily for the next 7 days, Benadryl every 8 hours and Pepcid once daily.  Return precautions were given to return with new or worsening symptoms.     FINAL CLINICAL IMPRESSION(S) / ED DIAGNOSES   Final diagnoses:  Right ear pain     Rx /  DC Orders   ED Discharge Orders          Ordered    ciprofloxacin (CIPRO) 750 MG tablet  2 times daily        05/09/22 1825    diphenhydrAMINE (BENADRYL ALLERGY) 25 mg capsule  Every 6 hours PRN        05/09/22 1826    famotidine (PEPCID) 20 MG tablet  Daily        05/09/22 1826             Note:  This document was prepared using Dragon voice recognition software and may include unintentional dictation errors.   Vallarie Mare [place], PA-C 05/09/22 1830    Nathaniel Man, MD 05/09/22 2013

## 2022-05-09 NOTE — ED Triage Notes (Signed)
Right ear swelling since Sunday and now she states it is spreading to the left side of her face. Denies any trouble hearing out of her right ear, no difficulty swallowing.

## 2022-05-09 NOTE — Discharge Instructions (Signed)
Take Cipro twice daily for seven days. Take 25 mg of Benadryl every 8 hours until symptoms resolve. Take 40 mg of Pepcid once daily.

## 2022-10-07 ENCOUNTER — Ambulatory Visit: Payer: Medicaid Other | Admitting: Adult Health

## 2023-04-12 ENCOUNTER — Telehealth: Payer: Self-pay

## 2023-04-12 DIAGNOSIS — E118 Type 2 diabetes mellitus with unspecified complications: Secondary | ICD-10-CM

## 2023-04-12 NOTE — Telephone Encounter (Signed)
Copied from CRM [id number]. Topic: Clinical - Lab/Test Results >> Apr 12, 2023  1:13 PM Fredrica W wrote: Reason for CRM: Patient called - has appointment in 1/31 states when she used to see provider at other practice she would come a head of time for labs and would like to do the same for this appt if possible. Fasting labs. No order showing. Thank You  Reason for CRM: Patient called - has appointment in 1/31 states when she used to see provider at other practice she would come a head of time for labs and would like to do the same for this appt if possible. Fasting labs. No order showing.  Please advise patient

## 2023-04-13 ENCOUNTER — Telehealth: Payer: Self-pay

## 2023-04-13 NOTE — Telephone Encounter (Signed)
Called patient and let her know labs were placed by Dr. Leron Croak for her to have drawn in advance and that she needed to be fasting, patient expressed understanding and had no questions or concerns to express at this time. Patient verbalized knowledge of location of office and stated she will be in later this morning for lab draw. Lab orders placed in lab and Brianna aware.

## 2023-04-14 LAB — CMP14+EGFR
ALT: 24 [IU]/L (ref 0–32)
AST: 23 [IU]/L (ref 0–40)
Albumin: 4.1 g/dL (ref 3.8–4.9)
Alkaline Phosphatase: 162 [IU]/L — ABNORMAL HIGH (ref 44–121)
BUN/Creatinine Ratio: 17 (ref 9–23)
BUN: 18 mg/dL (ref 6–24)
Bilirubin Total: 0.5 mg/dL (ref 0.0–1.2)
CO2: 20 mmol/L (ref 20–29)
Calcium: 9.1 mg/dL (ref 8.7–10.2)
Chloride: 101 mmol/L (ref 96–106)
Creatinine, Ser: 1.04 mg/dL — ABNORMAL HIGH (ref 0.57–1.00)
Globulin, Total: 2.9 g/dL (ref 1.5–4.5)
Glucose: 119 mg/dL — ABNORMAL HIGH (ref 70–99)
Potassium: 4.4 mmol/L (ref 3.5–5.2)
Sodium: 139 mmol/L (ref 134–144)
Total Protein: 7 g/dL (ref 6.0–8.5)
eGFR: 63 mL/min/{1.73_m2} (ref >59)

## 2023-04-14 LAB — MICROALBUMIN / CREATININE URINE RATIO
Creatinine, Urine: 88.8 mg/dL
Microalb/Creat Ratio: 16 mg/g{creat} (ref 0–29)
Microalbumin, Urine: 13.8 ug/mL

## 2023-04-14 LAB — LIPID PANEL
Chol/HDL Ratio: 5.4 {ratio} — ABNORMAL HIGH (ref 0.0–4.4)
Cholesterol, Total: 233 mg/dL — ABNORMAL HIGH (ref 100–199)
HDL: 43 mg/dL (ref >39)
LDL Chol Calc (NIH): 134 mg/dL — ABNORMAL HIGH (ref 0–99)
Triglycerides: 312 mg/dL — ABNORMAL HIGH (ref 0–149)
VLDL Cholesterol Cal: 56 mg/dL — ABNORMAL HIGH (ref 5–40)

## 2023-04-14 LAB — HEMOGLOBIN A1C
Est. average glucose Bld gHb Est-mCnc: 166 mg/dL
Hgb A1c MFr Bld: 7.4 % — ABNORMAL HIGH (ref 4.8–5.6)

## 2023-04-16 ENCOUNTER — Ambulatory Visit: Payer: Medicaid Other | Admitting: Family Medicine

## 2023-04-16 ENCOUNTER — Encounter: Payer: Self-pay | Admitting: Family Medicine

## 2023-04-16 VITALS — BP 139/86 | HR 90 | Temp 97.6°F | Resp 16 | Ht 60.0 in | Wt 283.2 lb

## 2023-04-16 DIAGNOSIS — E118 Type 2 diabetes mellitus with unspecified complications: Secondary | ICD-10-CM

## 2023-04-16 DIAGNOSIS — F339 Major depressive disorder, recurrent, unspecified: Secondary | ICD-10-CM | POA: Diagnosis not present

## 2023-04-16 DIAGNOSIS — E039 Hypothyroidism, unspecified: Secondary | ICD-10-CM

## 2023-04-16 DIAGNOSIS — Z1231 Encounter for screening mammogram for malignant neoplasm of breast: Secondary | ICD-10-CM

## 2023-04-16 MED ORDER — ATORVASTATIN CALCIUM 40 MG PO TABS: 40.0000 mg | ORAL_TABLET | Freq: Every day | ORAL | 3 refills | Status: DC

## 2023-04-16 MED ORDER — OZEMPIC (0.25 OR 0.5 MG/DOSE) 2 MG/1.5ML ~~LOC~~ SOPN: PEN_INJECTOR | SUBCUTANEOUS | 1 refills | Status: DC

## 2023-04-16 MED ORDER — AMPHETAMINE-DEXTROAMPHETAMINE 5 MG PO TABS: 5.0000 mg | ORAL_TABLET | Freq: Two times a day (BID) | ORAL | 0 refills | Status: DC

## 2023-04-16 MED ORDER — LEVOTHYROXINE SODIUM 50 MCG PO TABS: 50.0000 ug | ORAL_TABLET | Freq: Every day | ORAL | 3 refills | Status: DC

## 2023-04-16 NOTE — Progress Notes (Signed)
Established Patient Office Visit  Subjective   Patient ID: Veronica Harding, female    DOB: Dec 01, 1965  Age: 58 y.o. MRN: [id number]  Chief Complaint  Patient presents with   Establish Care   Medical Management of Chronic Issues    HPI  pleasant 58 yo woman with depression, AKI, DMT2, mixed hyperlipidemia, hypothyroidism.   Veronica Harding has not taken any medication in 3-4 months.  She is somewhat depressed and is not working.  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  She denies any SI or HI thoughts.   Printed and reviewed her labs with her.   Her renal function is back to normal at 1.04.  UMACR is 16.   Cholesterol 233, trigs 312, HDL 56 and LDL 134.  Advised that the goal is LDL 70 or less.  She has no objection to staring back on atorvastatin.   She denies peripheral edema, CP, orthopnea, PND.  She is not taking her levothyroxine but denies constipation, cold intolerance but has gained weight.       ROS    Objective:     BP 139/86 (BP Location: Right Arm, Patient Position: Sitting, Cuff Size: Normal)   Pulse 90   Temp 97.6 F (36.4 C) (Oral)   Resp 16   Ht 5' (1.524 m)   Wt 283 lb 3.2 oz (128.5 kg)   SpO2 98%   BMI 55.31 kg/m    Physical Exam Vitals and nursing note reviewed.  Constitutional:      Appearance: Normal appearance.  HENT:     Head: Normocephalic and atraumatic.  Eyes:     Conjunctiva/sclera: Conjunctivae normal.  Cardiovascular:     Rate and Rhythm: Normal rate and regular rhythm.  Pulmonary:     Effort: Pulmonary effort is normal.     Breath sounds: Normal breath sounds.  Musculoskeletal:     Right lower leg: No edema.     Left lower leg: No edema.  Skin:    General: Skin is warm and dry.  Neurological:     Mental Status: She is alert and oriented to person, place, and time.  Psychiatric:        Mood and Affect: Mood normal.        Behavior: Behavior normal.        Thought Content: Thought content normal.         Judgment: Judgment normal.        Diabetic foot exam was performed with the following findings:   No deformities, ulcerations, or other skin breakdown Normal sensation of 10g monofilament Intact posterior tibialis and dorsalis pedis pulses      No results found for any visits on 04/16/23.    The 10-year ASCVD risk score (Arnett DK, et al., 2019) is: 4.7%    Assessment & Plan:  Encounter for screening mammogram for malignant neoplasm of breast -     3D Screening Mammogram, Left and Right; Future  Depression, recurrent (HCC) -     Amphetamine-Dextroamphetamine; Take 1 tablet (5 mg total) by mouth 2 (two) times daily with a meal.  Dispense: 60 tablet; Refill: 0  Controlled type 2 diabetes mellitus with complication, without long-term current use of insulin (HCC) -     Atorvastatin Calcium; Take 1 tablet (40 mg total) by mouth daily.  Dispense: 90 tablet; Refill: 3 -     Ozempic (0.25 or 0.5 MG/DOSE); Inject 0.25 mg subq qwk for 4 wk then inject  0.5 mg subq qwk x4 wk, then go to full dose pen qwk  Dispense: 3 mL; Refill: 1  Acquired hypothyroidism -     Levothyroxine Sodium; Take 1 tablet (50 mcg total) by mouth daily.  Dispense: 90 tablet; Refill: 3     Return for also schedule for CPE w/pap.    Alease Medina, MD

## 2023-05-14 ENCOUNTER — Encounter: Payer: Self-pay | Admitting: Family Medicine

## 2023-05-14 ENCOUNTER — Ambulatory Visit: Payer: Medicaid Other | Admitting: Family Medicine

## 2023-05-14 VITALS — BP 139/84 | HR 95 | Temp 98.1°F | Resp 18 | Ht 60.0 in | Wt 283.0 lb

## 2023-05-14 DIAGNOSIS — F32A Depression, unspecified: Secondary | ICD-10-CM

## 2023-05-14 DIAGNOSIS — F339 Major depressive disorder, recurrent, unspecified: Secondary | ICD-10-CM

## 2023-05-14 DIAGNOSIS — F419 Anxiety disorder, unspecified: Secondary | ICD-10-CM

## 2023-05-14 DIAGNOSIS — Z124 Encounter for screening for malignant neoplasm of cervix: Secondary | ICD-10-CM

## 2023-05-14 MED ORDER — FLUOXETINE HCL 10 MG PO TABS: 10.0000 mg | ORAL_TABLET | Freq: Every day | ORAL | 3 refills | Status: DC

## 2023-05-14 MED ORDER — FLUOXETINE HCL 10 MG PO CAPS: 10.0000 mg | ORAL_CAPSULE | Freq: Every day | ORAL | 3 refills | Status: DC

## 2023-05-14 MED ORDER — AMPHETAMINE-DEXTROAMPHETAMINE 5 MG PO TABS: 5.0000 mg | ORAL_TABLET | Freq: Two times a day (BID) | ORAL | 0 refills | Status: DC

## 2023-05-14 NOTE — Progress Notes (Signed)
 Established Patient Office Visit  Subjective   Patient ID: Veronica Harding, female    DOB: 1965-09-22  Age: 58 y.o. MRN: [id number]  Chief Complaint  Patient presents with   Annual Exam    HPI Delightful 58 year old with obesity (BMI 40-49.9), depression, diabetes type 2, hypothyroidism, HTN, dyslipidemia, sleep apnea status post cholecystectomy.  She got off all of her medications in August and we have slowly been adding them back.  She is on levothyroxine 50 mcg daily, atorvastatin 40 mg daily and Adderall 5 mg twice daily.  She has her Ozempic prescription but has not started using it yet.  Her last A1c was 7.4.  She is very emotional today and tells me she does not want to get out of bed.  She cries very easily.  She has passive SI "it has okay not to wake up."  She denies any plans for.  Reports she would never kill herself because it would hurt her sons.  She is upset about her job as she is only working about 10 hours a week.  Both her sons live with her and they have to help pay bills and this upsets her. PHQ-9-17, GAD 7 is 8.    ROS    Objective:     BP 139/84 (BP Location: Left Arm, Patient Position: Sitting, Cuff Size: Large)   Pulse 95   Temp 98.1 F (36.7 C) (Oral)   Resp 18   Ht 5' (1.524 m)   Wt 283 lb (128.4 kg)   SpO2 96%   BMI 55.27 kg/m    Physical Exam Vitals and nursing note reviewed.  Constitutional:      Appearance: Normal appearance.  HENT:     Head: Normocephalic and atraumatic.  Eyes:     Conjunctiva/sclera: Conjunctivae normal.  Cardiovascular:     Rate and Rhythm: Normal rate and regular rhythm.  Pulmonary:     Effort: Pulmonary effort is normal.     Breath sounds: Normal breath sounds.  Musculoskeletal:     Right lower leg: No edema.     Left lower leg: No edema.  Skin:    General: Skin is warm and dry.  Neurological:     Mental Status: She is alert and oriented to person, place, and time.  Psychiatric:        Mood and Affect:  Mood normal.        Behavior: Behavior normal.        Thought Content: Thought content normal.        Judgment: Judgment normal.          No results found for any visits on 05/14/23.    The 10-year ASCVD risk score (Arnett DK, et al., 2019) is: 4.7%    Assessment & Plan:  Screening for cervical cancer  Anxiety and depression Assessment & Plan: She is crying all of the time.  She is passively suicidal.  "If I did not wake up it would be OK"  Would not commit suicide because she would not do that to her boys.  Restart prozac 10mg  q day   Orders: -     Ambulatory referral to Psychology -     FLUoxetine HCl; Take 1 capsule (10 mg total) by mouth daily.  Dispense: 90 capsule; Refill: 3  Depression, recurrent (HCC) -     Amphetamine-Dextroamphetamine; Take 1 tablet (5 mg total) by mouth 2 (two) times daily with a meal.  Dispense: 60 tablet; Refill: 0 -  FLUoxetine HCl; Take 1 capsule (10 mg total) by mouth daily.  Dispense: 90 capsule; Refill: 3     Return in about 4 weeks (around 06/11/2023) for mood disorder.    Alease Medina, MD

## 2023-05-14 NOTE — Assessment & Plan Note (Signed)
 She is crying all of the time.  She is passively suicidal.  "If I did not wake up it would be OK"  Would not commit suicide because she would not do that to her boys.  Restart prozac 10mg  q day

## 2023-05-16 ENCOUNTER — Encounter: Payer: Self-pay | Admitting: Family Medicine

## 2023-06-11 ENCOUNTER — Ambulatory Visit: Payer: Medicaid Other | Admitting: Family Medicine

## 2023-06-11 ENCOUNTER — Encounter: Payer: Self-pay | Admitting: Family Medicine

## 2023-06-11 VITALS — BP 181/99 | HR 92 | Temp 98.6°F | Resp 18 | Ht 60.0 in | Wt 282.0 lb

## 2023-06-11 DIAGNOSIS — E039 Hypothyroidism, unspecified: Secondary | ICD-10-CM | POA: Diagnosis not present

## 2023-06-11 DIAGNOSIS — F99 Mental disorder, not otherwise specified: Secondary | ICD-10-CM

## 2023-06-11 DIAGNOSIS — F419 Anxiety disorder, unspecified: Secondary | ICD-10-CM

## 2023-06-11 DIAGNOSIS — N182 Chronic kidney disease, stage 2 (mild): Secondary | ICD-10-CM

## 2023-06-11 DIAGNOSIS — F32A Anxiety disorder, unspecified: Secondary | ICD-10-CM

## 2023-06-11 DIAGNOSIS — F339 Major depressive disorder, recurrent, unspecified: Secondary | ICD-10-CM | POA: Diagnosis not present

## 2023-06-11 DIAGNOSIS — F5105 Insomnia due to other mental disorder: Secondary | ICD-10-CM

## 2023-06-11 DIAGNOSIS — I10 Essential (primary) hypertension: Secondary | ICD-10-CM

## 2023-06-11 DIAGNOSIS — K5909 Other constipation: Secondary | ICD-10-CM

## 2023-06-11 MED ORDER — AMPHETAMINE-DEXTROAMPHETAMINE 5 MG PO TABS
5.0000 mg | ORAL_TABLET | Freq: Two times a day (BID) | ORAL | 0 refills | Status: DC
Start: 2023-07-11 — End: 2023-10-22

## 2023-06-11 MED ORDER — FLUOXETINE HCL 20 MG PO TABS
20.0000 mg | ORAL_TABLET | Freq: Every day | ORAL | 3 refills | Status: DC
Start: 2023-06-11 — End: 2023-06-11

## 2023-06-11 MED ORDER — AMPHETAMINE-DEXTROAMPHETAMINE 5 MG PO TABS: 5.0000 mg | ORAL_TABLET | Freq: Two times a day (BID) | ORAL | 0 refills | Status: DC

## 2023-06-11 MED ORDER — TRAZODONE HCL 50 MG PO TABS
50.0000 mg | ORAL_TABLET | Freq: Every day | ORAL | 5 refills | Status: DC
Start: 2023-06-11 — End: 2024-01-24

## 2023-06-11 MED ORDER — FLUOXETINE HCL 20 MG PO CAPS: 20.0000 mg | ORAL_CAPSULE | Freq: Every day | ORAL | 3 refills | Status: DC

## 2023-06-11 MED ORDER — AMITIZA 8 MCG PO CAPS: 8.0000 ug | ORAL_CAPSULE | Freq: Two times a day (BID) | ORAL | 3 refills | Status: DC

## 2023-06-11 NOTE — Progress Notes (Signed)
 Established Patient Office Visit  Subjective   Patient ID: Veronica Harding, female    DOB: 02-03-1966  Age: 58 y.o. MRN: [id number]  Chief Complaint  Patient presents with   Mood    HPI Delightful 58 year old with obesity (BMI 40-49.9), depression, DMT2, hypothyroidism, HTN, dyslipidemia, OSA, s/p cholecystectomy. She got off all of her medicines back in August and we have slowly been adding them back.  She is on levothyroxine 50 mcg, atorvastatin 40 mg daily, Adderall 5 mg twice daily, fluoxetine 10 mg daily. She is doing better with her depression.  PHQ-9 is 6 previously 17 and GAD-7 is 3 previously 2.  She reports that she is better but still has "moments" when she gets down and does not want to do anything.  She gets up in the morning and sometimes goes back to bed.  She does not feel that she needs to see a psychiatrist but she is interested in an increased dosage of the Prozac.  She is currently taking Prozac 10 mg daily.  She also thinks an increase doses of the Adderall may be beneficial as well. Blood pressure is 165/100.  She was taking lisinopril and agrees to restart that. She needs a mammogram and a Pap smear.  She is working on getting the Pap smear scheduled. She has problems with constipation like the Amitiza refilled.  She takes trazodone 50 mg to sleep and would like that refilled.    ROS    Objective:     BP (!) 181/99 (BP Location: Left Arm, Patient Position: Sitting, Cuff Size: Large)   Pulse 92   Temp 98.6 F (37 C) (Oral)   Resp 18   Ht 5' (1.524 m)   Wt 282 lb (127.9 kg)   SpO2 97%   BMI 55.07 kg/m    Physical Exam Vitals and nursing note reviewed.  Constitutional:      Appearance: Normal appearance.  HENT:     Head: Normocephalic and atraumatic.  Eyes:     Conjunctiva/sclera: Conjunctivae normal.  Cardiovascular:     Rate and Rhythm: Normal rate and regular rhythm.  Pulmonary:     Effort: Pulmonary effort is normal.     Breath sounds:  Normal breath sounds.  Musculoskeletal:     Right lower leg: No edema.     Left lower leg: No edema.  Skin:    General: Skin is warm and dry.  Neurological:     Mental Status: She is alert and oriented to person, place, and time.  Psychiatric:        Mood and Affect: Mood normal.        Behavior: Behavior normal.        Thought Content: Thought content normal.        Judgment: Judgment normal.          No results found for any visits on 06/11/23.    The 10-year ASCVD risk score (Arnett DK, et al., 2019) is: 7.8%    Assessment & Plan:  Anxiety and depression Assessment & Plan: Increasing Prozac to 20mg  daily.  Refilled Adderall for 3 months.    Orders: -     Amphetamine-Dextroamphetamine; Take 1 tablet (5 mg total) by mouth 2 (two) times daily with a meal.  Dispense: 60 tablet; Refill: 0 -     Amphetamine-Dextroamphetamine; Take 1 tablet (5 mg total) by mouth 2 (two) times daily with a meal.  Dispense: 60 tablet; Refill: 0 -     Amphetamine-Dextroamphetamine; Take  1 tablet (5 mg total) by mouth 2 (two) times daily with a meal.  Dispense: 60 tablet; Refill: 0 -     FLUoxetine HCl; Take 1 capsule (20 mg total) by mouth daily.  Dispense: 90 capsule; Refill: 3  Primary hypertension Assessment & Plan: Restart lisinopril 5 mg daily.  Will check labs in a week.     Acquired hypothyroidism  Depression, recurrent (HCC) -     Amphetamine-Dextroamphetamine; Take 1 tablet (5 mg total) by mouth 2 (two) times daily with a meal.  Dispense: 60 tablet; Refill: 0 -     Amphetamine-Dextroamphetamine; Take 1 tablet (5 mg total) by mouth 2 (two) times daily with a meal.  Dispense: 60 tablet; Refill: 0 -     Amphetamine-Dextroamphetamine; Take 1 tablet (5 mg total) by mouth 2 (two) times daily with a meal.  Dispense: 60 tablet; Refill: 0 -     FLUoxetine HCl; Take 1 capsule (20 mg total) by mouth daily.  Dispense: 90 capsule; Refill: 3  Other constipation -     Amitiza; Take 1 capsule (8  mcg total) by mouth 2 (two) times daily.  Dispense: 30 capsule; Refill: 3  Insomnia due to other mental disorder -     traZODone HCl; Take 1 tablet (50 mg total) by mouth at bedtime.  Dispense: 30 tablet; Refill: 5  Stage 2 chronic kidney disease Assessment & Plan: Will chek labs in a week.      Return in about 4 weeks (around 07/09/2023).    Alease Medina, MD

## 2023-06-11 NOTE — Assessment & Plan Note (Signed)
 Increasing Prozac to 20mg  daily.  Refilled Adderall for 3 months.

## 2023-06-13 NOTE — Assessment & Plan Note (Addendum)
 Restart lisinopril 5 mg daily.  Will check labs in a week.

## 2023-06-13 NOTE — Assessment & Plan Note (Signed)
 Will chek labs in a week.

## 2023-06-14 ENCOUNTER — Other Ambulatory Visit: Payer: Self-pay | Admitting: Family Medicine

## 2023-06-14 ENCOUNTER — Telehealth: Payer: Self-pay | Admitting: Family Medicine

## 2023-06-14 DIAGNOSIS — I10 Essential (primary) hypertension: Secondary | ICD-10-CM

## 2023-06-14 MED ORDER — AMLODIPINE BESYLATE 10 MG PO TABS: 10.0000 mg | ORAL_TABLET | Freq: Every day | ORAL | 11 refills | Status: DC

## 2023-06-14 NOTE — Telephone Encounter (Signed)
 Called patient and asked her to start Norvasc 10mg  daily for her blood pressure.  Have sent this to pharmacy.

## 2023-06-19 ENCOUNTER — Ambulatory Visit
Admission: EM | Admit: 2023-06-19 | Discharge: 2023-06-19 | Disposition: A | Discharge status: Home / Self Care | Attending: Emergency Medicine | Admitting: Emergency Medicine

## 2023-06-19 DIAGNOSIS — S50861A Insect bite (nonvenomous) of right forearm, initial encounter: Secondary | ICD-10-CM

## 2023-06-19 DIAGNOSIS — L989 Disorder of the skin and subcutaneous tissue, unspecified: Secondary | ICD-10-CM | POA: Diagnosis not present

## 2023-06-19 DIAGNOSIS — W57XXXA Bitten or stung by nonvenomous insect and other nonvenomous arthropods, initial encounter: Secondary | ICD-10-CM | POA: Diagnosis not present

## 2023-06-19 MED ORDER — MUPIROCIN 2 % EX OINT: 1.0000 | TOPICAL_OINTMENT | Freq: Two times a day (BID) | CUTANEOUS | 0 refills | Status: DC

## 2023-06-19 NOTE — ED Provider Notes (Signed)
 Veronica Harding    CSN: [id number] Arrival date & time: 06/19/23  1551      History   Chief Complaint Chief Complaint  Patient presents with   Insect Bite    HPI Veronica Harding is a 58 y.o. female.  Patient presents with two painful red lesions on her right forearm x 2 days.  She thinks these are insect bites but did not see what bit her.  No fever or wound drainage.  She states her right forearm is so painful that she had difficulty sleeping last night.  She took Tylenol last night but none taken today.  She took Benadryl last night and applied Neosporin to the area yesterday.  No treatments today.  The history is provided by the patient and medical records.    Past Medical History:  Diagnosis Date   Anxiety and depression    Constipation    Dyslipidemia    GERD (gastroesophageal reflux disease)    Hypertension    Hypothyroidism    Insomnia    Obesity, Class III, BMI 40-49.9 (morbid obesity) (HCC) 06/08/2021   Sleep apnea    does not wear CPAP    Patient Active Problem List   Diagnosis Date Noted   Stage 2 chronic kidney disease 12/23/2021   Primary hypertension 12/23/2021   Chronic migraine w/o aura w/o status migrainosus, not intractable 08/19/2021   Morbid obesity (HCC) 08/19/2021   Obstructive sleep apnea 08/19/2021   HLD (hyperlipidemia) 06/09/2021   TIA (transient ischemic attack) 06/08/2021   Anxiety and depression 06/08/2021   Hypertension 06/08/2021   Hypothyroidism 06/08/2021   Sleep apnea 06/08/2021   Obesity, Class III, BMI 40-49.9 (morbid obesity) (HCC) 06/08/2021    Past Surgical History:  Procedure Laterality Date   CHOLECYSTECTOMY     COLONOSCOPY N/A 04/28/2021   Procedure: COLONOSCOPY;  Surgeon: Jaynie Collins, DO;  Location: [facility] ENDOSCOPY;  Service: Gastroenterology;  Laterality: N/A;  DM    OB History   No obstetric history on file.      Home Medications    Prior to Admission medications   Medication Sig Start Date  End Date Taking? Authorizing Provider  mupirocin ointment (BACTROBAN) 2 % Apply 1 Application topically 2 (two) times daily. 06/19/23  Yes Mickie Bail, NP  albuterol (PROAIR HFA) 108 (90 Base) MCG/ACT inhaler INHALE 2 PUFFS BY MOUTH EVERY 4 TO 6 HOURS AS NEEDED Patient not taking: Reported on 04/16/2023    [provider]  AMITIZA 8 MCG capsule Take 1 capsule (8 mcg total) by mouth 2 (two) times daily. 06/11/23   Ziglar, Eli Phillips, MD  amLODipine (NORVASC) 10 MG tablet Take 1 tablet (10 mg total) by mouth daily. 06/14/23   Ziglar, Eli Phillips, MD  amphetamine-dextroamphetamine (ADDERALL) 5 MG tablet Take 1 tablet (5 mg total) by mouth 2 (two) times daily with a meal. 07/11/23   Ziglar, Eli Phillips, MD  amphetamine-dextroamphetamine (ADDERALL) 5 MG tablet Take 1 tablet (5 mg total) by mouth 2 (two) times daily with a meal. 06/11/23   Ziglar, Eli Phillips, MD  amphetamine-dextroamphetamine (ADDERALL) 5 MG tablet Take 1 tablet (5 mg total) by mouth 2 (two) times daily with a meal. 08/10/23   Ziglar, Eli Phillips, MD  aspirin EC 81 MG tablet Take 81 mg by mouth daily. Swallow whole. Patient not taking: Reported on 04/16/2023    [provider]  atorvastatin (LIPITOR) 40 MG tablet Take 1 tablet (40 mg total) by mouth daily. 04/16/23   Ziglar,  Eli Phillips, MD  buPROPion (WELLBUTRIN XL) 150 MG 24 hr tablet Take 150 mg by mouth daily. Patient not taking: Reported on 04/16/2023    [provider]  buPROPion (WELLBUTRIN XL) 150 MG 24 hr tablet Take 1 tablet by mouth daily. Patient not taking: Reported on 04/16/2023 01/07/21   [provider]  diphenhydrAMINE (BENADRYL ALLERGY) 25 mg capsule Take 1 capsule (25 mg total) by mouth every 6 (six) hours as needed for up to 5 days. Patient not taking: Reported on 05/14/2023 05/09/22 05/14/22  Orvil Feil, PA-C  famotidine (PEPCID) 20 MG tablet Take 2 tablets (40 mg total) by mouth daily for 5 days. Patient not taking: Reported on 05/14/2023 05/09/22 05/14/22   Orvil Feil, PA-C  FLUoxetine (PROZAC) 20 MG capsule Take 1 capsule (20 mg total) by mouth daily. 06/11/23   Ziglar, Eli Phillips, MD  Indacaterol-Glycopyrrolate (UTIBRON NEOHALER) 27.5-15.6 MCG CAPS INHALE 1 CAPSULE BY MOUTH TWICE A DAY AS NEEDED. Patient not taking: Reported on 04/16/2023    [provider]  levothyroxine (SYNTHROID) 50 MCG tablet Take 1 tablet (50 mcg total) by mouth daily. 04/16/23   Ziglar, Eli Phillips, MD  metFORMIN (GLUCOPHAGE) 500 MG tablet TAKE 1 TABLET BY MOUTH TWICE A DAY WITH MORNING AND EVENING MEALS Patient not taking: Reported on 04/16/2023    [provider]  methocarbamol (ROBAXIN) 750 MG tablet Take 1 tablet by mouth 2 (two) times daily as needed. Patient not taking: Reported on 04/16/2023    [provider]  naltrexone (DEPADE) 50 MG tablet Take 25 mg by mouth daily. Patient not taking: Reported on 04/16/2023    [provider]  naltrexone (DEPADE) 50 MG tablet Take by mouth. Patient not taking: Reported on 04/16/2023    [provider]  nystatin cream (MYCOSTATIN) APPLY TO AFFECTED AREA TWICE A DAY Patient not taking: Reported on 04/16/2023 09/15/21   [provider]  polyethylene glycol (MIRALAX / GLYCOLAX) 17 g packet MIX 1 PACKET IN 8 OUNCES OF LIQUID AND DRINK ONCE DAILY./NEEDS APPOINTMENT BEFORE MORE REFILLS Patient not taking: Reported on 04/16/2023 03/14/15   [provider]  pravastatin (PRAVACHOL) 20 MG tablet Take 1 tablet by mouth daily. Patient not taking: Reported on 04/16/2023    [provider]  Semaglutide,0.25 or 0.5MG /DOS, (OZEMPIC, 0.25 OR 0.5 MG/DOSE,) 2 MG/1.5ML SOPN Inject 0.25 mg subq qwk for 4 wk then inject 0.5 mg subq qwk x4 wk, then go to full dose pen qwk 04/16/23   Ziglar, Eli Phillips, MD  traZODone (DESYREL) 50 MG tablet Take 1 tablet (50 mg total) by mouth at bedtime. 06/11/23   Ziglar, Eli Phillips, MD  triamcinolone cream (KENALOG) 0.1 % Apply 1 Application topically 2 (two) times  daily. Patient not taking: Reported on 04/16/2023    [provider]    Family History Family History  Problem Relation Age of Onset   Stroke Mother 77    Social History Social History   Tobacco Use   Smoking status: Never    Passive exposure: Current   Smokeless tobacco: Never  Vaping Use   Vaping status: Never Used  Substance Use Topics   Alcohol use: Never   Drug use: Never     Allergies   Bacid and Tilactase   Review of Systems Review of Systems  Constitutional:  Negative for chills and fever.  Musculoskeletal:  Negative for arthralgias and joint swelling.  Skin:  Positive for color change and wound.  Neurological:  Negative for weakness  and numbness.     Physical Exam Triage Vital Signs ED Triage Vitals  Encounter Vitals Group     BP      Systolic BP Percentile      Diastolic BP Percentile      Pulse      Resp      Temp      Temp src      SpO2      Weight      Height      Head Circumference      Peak Flow      Pain Score      Pain Loc      Pain Education      Exclude from Growth Chart    No data found.  Updated Vital Signs BP 109/79 (BP Location: Left Arm)   Pulse 100   Temp 98.3 F (36.8 C) (Tympanic)   Resp 18   SpO2 97%   Visual Acuity Right Eye Distance:   Left Eye Distance:   Bilateral Distance:    Right Eye Near:   Left Eye Near:    Bilateral Near:     Physical Exam Constitutional:      General: She is not in acute distress. HENT:     Mouth/Throat:     Mouth: Mucous membranes are moist.  Cardiovascular:     Rate and Rhythm: Normal rate and regular rhythm.  Pulmonary:     Effort: Pulmonary effort is normal. No respiratory distress.  Musculoskeletal:        General: No swelling or deformity. Normal range of motion.  Skin:    General: Skin is warm and dry.     Capillary Refill: Capillary refill takes less than 2 seconds.     Findings: Lesion present.     Comments: 2 lesions on right forearm with localized  erythema.  No induration or drainage.  See pictures for details.  Neurological:     General: No focal deficit present.     Mental Status: She is alert.     Sensory: No sensory deficit.     Motor: No weakness.         UC Treatments / Results  Labs (all labs ordered are listed, but only abnormal results are displayed) Labs Reviewed - No data to display  EKG   Radiology No results found.  Procedures Procedures (including critical care time)  Medications Ordered in UC Medications - No data to display  Initial Impression / Assessment and Plan / UC Course  I have reviewed the triage vital signs and the nursing notes.  Pertinent labs & imaging results that were available during my care of the patient were reviewed by me and considered in my medical decision making (see chart for details).    Skin lesion, insect bites on right forearm.  Afebrile vital signs are stable.  The areas do not have underlying induration.  They have localized erythema but no red streaks.  No drainage.  Treating today with mupirocin ointment.  Discussed OTC Zyrtec also daily x 7 days.  Instructed patient to follow-up with her PCP if she is not improving.  She agrees to plan of care.  Final Clinical Impressions(s) / UC Diagnoses   Final diagnoses:  Insect bite of right forearm, initial encounter  Skin lesion     Discharge Instructions      Use the mupirocin ointment as directed.  Take Zyrtec as directed.  Follow-up with your primary care provider if your symptoms are  not improving.     ED Prescriptions     Medication Sig Dispense Auth. Provider   mupirocin ointment (BACTROBAN) 2 % Apply 1 Application topically 2 (two) times daily. 22 g Mickie Bail, NP      PDMP not reviewed this encounter.   Mickie Bail, NP 06/19/23 [phone]

## 2023-06-19 NOTE — ED Triage Notes (Addendum)
 Patient to Urgent Care with complaints of an insect bite present to anterior right forearm/ posterior right forearm. No drainage. Reports aching/ burning pain from her elbow into her fingers.   Symptoms started Thursday. Reports pain is worsening. Unable to sleep last night.   Unsure of what bit/ stung her.

## 2023-06-19 NOTE — Discharge Instructions (Addendum)
Use the mupirocin ointment as directed.  Take Zyrtec as directed.  Follow-up with your primary care provider if your symptoms are not improving.

## 2023-06-20 ENCOUNTER — Other Ambulatory Visit: Payer: Self-pay

## 2023-06-20 ENCOUNTER — Emergency Department
Admission: EM | Admit: 2023-06-20 | Discharge: 2023-06-20 | Disposition: A | Discharge status: Home / Self Care | Attending: Emergency Medicine | Admitting: Emergency Medicine

## 2023-06-20 DIAGNOSIS — I10 Essential (primary) hypertension: Secondary | ICD-10-CM | POA: Diagnosis not present

## 2023-06-20 DIAGNOSIS — L03113 Cellulitis of right upper limb: Secondary | ICD-10-CM | POA: Insufficient documentation

## 2023-06-20 DIAGNOSIS — S50861A Insect bite (nonvenomous) of right forearm, initial encounter: Secondary | ICD-10-CM | POA: Insufficient documentation

## 2023-06-20 DIAGNOSIS — L0390 Cellulitis, unspecified: Secondary | ICD-10-CM

## 2023-06-20 DIAGNOSIS — W57XXXD Bitten or stung by nonvenomous insect and other nonvenomous arthropods, subsequent encounter: Secondary | ICD-10-CM

## 2023-06-20 DIAGNOSIS — S59911A Unspecified injury of right forearm, initial encounter: Secondary | ICD-10-CM | POA: Diagnosis present

## 2023-06-20 DIAGNOSIS — W57XXXA Bitten or stung by nonvenomous insect and other nonvenomous arthropods, initial encounter: Secondary | ICD-10-CM | POA: Diagnosis not present

## 2023-06-20 MED ORDER — HYDROCODONE-ACETAMINOPHEN 5-325 MG PO TABS
1.0000 | ORAL_TABLET | Freq: Once | ORAL | Status: AC
  Administered 2023-06-20: 1 via ORAL
  Filled 2023-06-20: qty 1

## 2023-06-20 MED ORDER — HYDROCODONE-ACETAMINOPHEN 5-325 MG PO TABS: 1.0000 | ORAL_TABLET | ORAL | 0 refills | Status: DC | PRN

## 2023-06-20 MED ORDER — DOXYCYCLINE HYCLATE 100 MG PO TABS
100.0000 mg | ORAL_TABLET | Freq: Once | ORAL | Status: AC
  Administered 2023-06-20: 100 mg via ORAL
  Filled 2023-06-20: qty 1

## 2023-06-20 MED ORDER — DOXYCYCLINE HYCLATE 100 MG PO TABS: 100.0000 mg | ORAL_TABLET | Freq: Two times a day (BID) | ORAL | 0 refills | Status: DC

## 2023-06-20 NOTE — ED Provider Notes (Signed)
   [facility] Provider Note    Event Date/Time   First MD Initiated Contact with Patient 06/20/23 1812     (approximate)  History   Chief Complaint: Insect Bite  HPI  Veronica Harding is a 58 y.o. female with a past medical history of gastric reflux, hypertension, obesity, presents emergency department for 2 insect bites to the right arm.  According to the patient for the past several days she has had pain to the 2 areas of her right arm where she has had an insect bite.  Patient did not see the insect that bit her but has had pain and swelling to these areas and increased pain.  Patient denies any fever.  Patient has been using Tylenol without relief also took a sleep medication last night but could not sleep due to the pain so she came to the emergency department for evaluation.  Physical Exam   Triage Vital Signs: ED Triage Vitals [06/20/23 1643]  Encounter Vitals Group     BP (!) 136/94     Systolic BP Percentile      Diastolic BP Percentile      Pulse Rate (!) 107     Resp 20     Temp 98.1 F (36.7 C)     Temp Source Oral     SpO2 98 %     Weight      Height      Head Circumference      Peak Flow      Pain Score 8     Pain Loc      Pain Education      Exclude from Growth Chart     Most recent vital signs: Vitals:   06/20/23 1643  BP: (!) 136/94  Pulse: (!) 107  Resp: 20  Temp: 98.1 F (36.7 C)  SpO2: 98%    General: Awake, no distress.  CV:  Good peripheral perfusion.   Resp:  Normal effort.  Abd:  No distention.  Other:  Patient has 2 areas of erythema with a central area consistent with a bite mark on her right forearm each area is approximately 3 cm in diameter.  No sign of abscess no fluctuance.   ED Results / Procedures / Treatments   MEDICATIONS ORDERED IN ED: Medications - No data to display   IMPRESSION / MDM / ASSESSMENT AND PLAN / ED COURSE  I reviewed the triage vital signs and the nursing notes.  Patient's  presentation is most consistent with acute illness / injury with system symptoms.  Patient presents emergency department with 2 areas consistent with insect bites to her right forearm that have now become erythematous larger and more painful.  Will cover with doxycycline as a precaution.  Patient did not see what bit her.  We will prescribe a very short course of pain medication for the patient she will follow-up with her doctor.  Patient agreeable to plan of care.  Discussed return precautions for any fever.   FINAL CLINICAL IMPRESSION(S) / ED DIAGNOSES   Insect bite Cellulitis    Note:  This document was prepared using Dragon voice recognition software and may include unintentional dictation errors.   Minna Antis, MD 06/20/23 Silva Bandy

## 2023-06-20 NOTE — ED Triage Notes (Signed)
 Pt to ED via POV from home. Pt reports insect bite to right anterior and posterior forearm. Pt reports increased right arm pain. Pt seen at [facility] on 4/5 and given ointment to use PRN. Pt reports no relief and unable to sleep. Unsure of what insect bit her.

## 2023-06-23 ENCOUNTER — Ambulatory Visit: Payer: Self-pay

## 2023-06-23 NOTE — Telephone Encounter (Signed)
 Copied from CRM [id number]. Topic: Clinical - Red Word Triage >> Jun 23, 2023  5:26 PM Emylou G wrote: [facility] that prompted transfer to Nurse Triage: infected?  warm to the touch, off and on shooting pain, elbow is hurting.Marland Kitchen keeping her up at night.Marland Kitchen already been to the hospital..   Chief Complaint: Insect bites  Symptoms: Insect bites, pain to bites  Frequency: Single episode  Pertinent Negatives: Patient denies fever, shortness of breath, hives  Disposition: [] ED /[] Urgent Care (no appt availability in office) / [x] Appointment(In office/virtual)/ []  [facility]/ [] Home Care/ [] Refused Recommended Disposition /[] [facility]/ []  Follow-up with PCP Additional Notes: Patient reprots she was bitten by an insect 6-7 days ago on her right forearm. She states that she was seen at urgent care and the ED for it and was given pain medication and a topical antibiotic. She states that she has been using the cream but that she is still concerned about infection and would like to be seen in the office. Appiontment made for the patient tomorrow for evaluation of her bites.     Reason for Disposition  Bite starts to look bad (e.g., blister, purplish skin, ulcer)  (Exception: There is just minor swelling or small red bump.)  Answer Assessment - Initial Assessment Questions 1. TYPE of INSECT: "What type of insect was it?"      Unsure, possibly spider  2. ONSET: "When did you get bitten?"      6-7 days ago  3. LOCATION: "Where is the insect bite located?"      Right forearm, two bites 4. REDNESS: "Is the area red or pink?" If Yes, ask: "What size is area of redness?" (inches or cm). "When did the redness start?"     Red at bites, no spreading redness   5. PAIN: "Is there any pain?" If Yes, ask: "How bad is it?"  (Scale 1-10; or mild, moderate, severe)     Yes, 5/10, intermittent  6. ITCHING: "Does it itch?" If Yes, ask: "How bad is the itch?"    - MILD: doesn't interfere with normal  activities   - MODERATE-SEVERE: interferes with work, school, sleep, or other activities      No 7. SWELLING: "How big is the swelling?" (inches, cm, or compare to coins)     No 8. OTHER SYMPTOMS: "Do you have any other symptoms?"  (e.g., difficulty breathing, hives)     No 9. PREGNANCY: "Is there any chance you are pregnant?" "When was your last menstrual period?"     No  Protocols used: Insect Bite-A-AH

## 2023-06-24 ENCOUNTER — Encounter: Payer: Self-pay | Admitting: Family Medicine

## 2023-06-24 ENCOUNTER — Ambulatory Visit: Admitting: Family Medicine

## 2023-06-24 VITALS — BP 130/82 | HR 96 | Temp 97.8°F | Resp 18 | Ht 60.0 in | Wt 282.0 lb

## 2023-06-24 DIAGNOSIS — W57XXXA Bitten or stung by nonvenomous insect and other nonvenomous arthropods, initial encounter: Secondary | ICD-10-CM | POA: Insufficient documentation

## 2023-06-24 DIAGNOSIS — W57XXXD Bitten or stung by nonvenomous insect and other nonvenomous arthropods, subsequent encounter: Secondary | ICD-10-CM

## 2023-06-24 DIAGNOSIS — S50861D Insect bite (nonvenomous) of right forearm, subsequent encounter: Secondary | ICD-10-CM | POA: Diagnosis not present

## 2023-06-24 DIAGNOSIS — R1114 Bilious vomiting: Secondary | ICD-10-CM

## 2023-06-24 DIAGNOSIS — L03119 Cellulitis of unspecified part of limb: Secondary | ICD-10-CM | POA: Diagnosis not present

## 2023-06-24 DIAGNOSIS — R112 Nausea with vomiting, unspecified: Secondary | ICD-10-CM | POA: Insufficient documentation

## 2023-06-24 MED ORDER — PROMETHAZINE HCL 25 MG/ML IJ SOLN
25.0000 mg | Freq: Once | INTRAMUSCULAR | Status: DC
Start: 2023-06-24 — End: 2023-06-24

## 2023-06-24 MED ORDER — ONDANSETRON 8 MG PO TBDP
8.0000 mg | ORAL_TABLET | Freq: Once | ORAL | Status: AC
  Administered 2023-06-24: 8 mg via ORAL

## 2023-06-24 MED ORDER — HYDROCODONE-ACETAMINOPHEN 5-325 MG PO TABS
1.0000 | ORAL_TABLET | ORAL | 0 refills | Status: DC | PRN
Start: 2023-06-24 — End: 2023-11-20

## 2023-06-24 MED ORDER — ONDANSETRON HCL 4 MG PO TABS: 4.0000 mg | ORAL_TABLET | Freq: Three times a day (TID) | ORAL | 0 refills | Status: DC | PRN

## 2023-06-24 NOTE — Progress Notes (Signed)
 Established Patient Office Visit  Subjective   Patient ID: Veronica Harding, female    DOB: [birth year]  Age: 58 y.o. MRN: [id number]  Chief Complaint  Patient presents with   Insect Bite   Follow-up    HPI Delightful 58 yo with anxiety/depression, HTN, DMT2, mixed hyperlipidemia, hypothyroidism and constipation.  She thinks she got bitten by something, she did not see the bug, on her right forearm.  The areas became quite painful and had expanding redness.  She went to UC and they gave her a topical antibiotic but this did not help and the area redness continued to grow.  Her forearm was hurting from the elbow to her hand.  She was getting shooting lancing pains as well as burning sensation.  The following day she went to the ED.  She was prescribed doxycycline 100 mg twice daily and given eight Vicodin 5/500.  The redness has subsided.  She still describing a lancing pain along with tingling and burning sensation.  The Vicodin has been helpful.  She is taking at night to go to sleep. She got up this morning and took her medication and vomited about 20 minutes later.  Her emesis was yellow-green.  She is on Ozempic 0.5 mg which could have caused her nausea. She is feeling better emotionally.  She gets to care for her grandson often and that improves her mood.    ROS    Objective:     BP 130/82 (BP Location: Right Arm, Patient Position: Sitting, Cuff Size: Large)   Pulse 96   Temp 97.8 F (36.6 C) (Oral)   Resp 18   Ht 5' (1.524 m)   Wt 282 lb (127.9 kg)   SpO2 97%   BMI 55.07 kg/m    Physical Exam Vitals reviewed.  Constitutional:      Appearance: Normal appearance.  HENT:     Head: Normocephalic.  Eyes:     General:        Right eye: No discharge.        Left eye: No discharge.  Cardiovascular:     Rate and Rhythm: Normal rate.  Pulmonary:     Effort: Pulmonary effort is normal.  Skin:    Comments: Two scabbed over lesions one anterior and one posterior on right  forearm.  No surrounding erythema.  No edema.    Neurological:     Mental Status: She is alert and oriented to person, place, and time.  Psychiatric:        Mood and Affect: Mood normal.        Behavior: Behavior normal.        Thought Content: Thought content normal.        Judgment: Judgment normal.          No results found for any visits on 06/24/23.    The 10-year ASCVD risk score (Arnett DK, et al., 2019) is: 4.1%    Assessment & Plan:  Bilious vomiting with nausea Assessment & Plan: Could be a side effect of doxycycline or a side effect of Ozempic 0.5mg .  Gave her a prescription for ondansetron 4mg  every 8 hours.  If the nausea continues after completing the doxycycline prescription the if would indicate Ozempic is the culprit.  We may have to back down to the lower dosage of Ozempic for a while if she wants to continue the GLP-1 inhibitor.    Orders: -     Ondansetron -     Ondansetron HCl;  Take 1 tablet (4 mg total) by mouth every 8 (eight) hours as needed for nausea or vomiting.  Dispense: 20 tablet; Refill: 0  Cellulitis of forearm -     HYDROcodone-Acetaminophen; Take 1 tablet by mouth every 4 (four) hours as needed.  Dispense: 6 tablet; Refill: 0  Insect bite of right forearm, subsequent encounter Assessment & Plan: Area of redness has subsided since taking doxycycline.  Finish doxycycline prescription.  Still having lancing pains, burning sensation and stinging.  Pain ranges from elbow to fingers.  Hydrocodone 5/500 #6.  Follow-up in clinic if pain does not subside.  May consider trying gabapentin as description of pain sounds neuropathic.      Return previously scheduled appointment.    Alease Medina, MD

## 2023-06-24 NOTE — Assessment & Plan Note (Signed)
 Area of redness has subsided since taking doxycycline.  Finish doxycycline prescription.  Still having lancing pains, burning sensation and stinging.  Pain ranges from elbow to fingers.  Hydrocodone 5/500 #6.  Follow-up in clinic if pain does not subside.  May consider trying gabapentin as description of pain sounds neuropathic.

## 2023-06-24 NOTE — Assessment & Plan Note (Signed)
 Could be a side effect of doxycycline or a side effect of Ozempic 0.5mg .  Gave her a prescription for ondansetron 4mg  every 8 hours.  If the nausea continues after completing the doxycycline prescription the if would indicate Ozempic is the culprit.  We may have to back down to the lower dosage of Ozempic for a while if she wants to continue the GLP-1 inhibitor.

## 2023-07-09 ENCOUNTER — Ambulatory Visit: Admitting: Family Medicine

## 2023-07-09 ENCOUNTER — Encounter: Payer: Self-pay | Admitting: Family Medicine

## 2023-07-09 DIAGNOSIS — G459 Transient cerebral ischemic attack, unspecified: Secondary | ICD-10-CM

## 2023-07-09 DIAGNOSIS — F419 Anxiety disorder, unspecified: Secondary | ICD-10-CM

## 2023-07-09 DIAGNOSIS — I10 Essential (primary) hypertension: Secondary | ICD-10-CM | POA: Diagnosis not present

## 2023-07-09 DIAGNOSIS — F339 Major depressive disorder, recurrent, unspecified: Secondary | ICD-10-CM

## 2023-07-09 DIAGNOSIS — E118 Type 2 diabetes mellitus with unspecified complications: Secondary | ICD-10-CM | POA: Diagnosis not present

## 2023-07-11 DIAGNOSIS — E118 Type 2 diabetes mellitus with unspecified complications: Secondary | ICD-10-CM | POA: Insufficient documentation

## 2023-07-11 MED ORDER — AMPHETAMINE-DEXTROAMPHETAMINE 10 MG PO TABS: 10.0000 mg | ORAL_TABLET | Freq: Two times a day (BID) | ORAL | 0 refills | Status: DC

## 2023-07-11 MED ORDER — AMPHETAMINE-DEXTROAMPHETAMINE 10 MG PO TABS
10.0000 mg | ORAL_TABLET | Freq: Two times a day (BID) | ORAL | 0 refills | Status: DC
Start: 2023-07-22 — End: 2023-10-22

## 2023-07-11 MED ORDER — SEMAGLUTIDE (1 MG/DOSE) 4 MG/3ML ~~LOC~~ SOPN: 1.0000 mg | PEN_INJECTOR | SUBCUTANEOUS | 2 refills | Status: DC

## 2023-07-11 MED ORDER — FLUOXETINE HCL 20 MG PO CAPS: 20.0000 mg | ORAL_CAPSULE | Freq: Every day | ORAL | 3 refills | Status: DC

## 2023-07-11 NOTE — Assessment & Plan Note (Signed)
 04/13/2023 A1c 7.4%.  On Ozempic  0.5 mg weekly.  Increase to Ozempic  1 mg weekly.  A1c at next office visit.

## 2023-07-11 NOTE — Progress Notes (Signed)
 Established Patient Office Visit  Subjective   Patient ID: Veronica Harding, female    DOB: [birth year]  Age: 58 y.o. MRN: [id number]  Chief Complaint  Patient presents with   Medical Management of Chronic Issues    HPI Delightful 58 year old with anxiety/depression, HTN, DMT2, mixed hyperlipidemia, hypothyroidism and constipation. She is on Ozempic  0.5 mg weekly she denies having any nausea.  She did have some constipation but it may have been related to opioid usage.  Since she got over that bout of constipation she has not had it again.  She has not lost any weight since starting Ozempic .  She is interested in increasing her dosage if possible.  She does have type 2 diabetes.  Last A1c at 1/ 28/25 was 7.4%. At last visit PHQ-9 is 17 and GAD-7 is 3.  She thinks that she is depressed because she has not lost any weight yet.  She denies any SI or HI.  She is on fluoxetine  10 mg daily, trazodone  50 mg and Adderall 5 mg twice daily.  She does  think that she needs an increase in her medication, both fluoxetine  and Adderall.  The Adderall is not giving her enough energy to get through the day.  She feels that her current mood could be elevated more with fluoxetine  increase.  She still has a lot of time when she is sad and depressed.  She just had her Adderall 5 mg twice daily filled on 06/24/2023. 04/13/2023 total cholesterol 233, Triggs 312, HDL 43 and LDL 134.  She is back on atorvastatin  40 mg daily but has not been on this long enough to recheck her labs.    ROS    Objective:     BP (!) 143/81 (BP Location: Left Arm, Patient Position: Sitting, Cuff Size: Large)   Pulse 91   Temp 98.1 F (36.7 C) (Oral)   Resp 18   Ht 5' (1.524 m)   Wt 285 lb (129.3 kg)   SpO2 97%   BMI 55.66 kg/m    Physical Exam Vitals and nursing note reviewed.  Constitutional:      Appearance: Normal appearance.  HENT:     Head: Normocephalic and atraumatic.  Eyes:     Conjunctiva/sclera: Conjunctivae  normal.  Cardiovascular:     Rate and Rhythm: Normal rate and regular rhythm.  Pulmonary:     Effort: Pulmonary effort is normal.     Breath sounds: Normal breath sounds.  Musculoskeletal:     Right lower leg: No edema.     Left lower leg: No edema.  Skin:    General: Skin is warm and dry.  Neurological:     Mental Status: She is alert and oriented to person, place, and time.  Psychiatric:        Mood and Affect: Mood normal.        Behavior: Behavior normal.        Thought Content: Thought content normal.        Judgment: Judgment normal.          No results found for any visits on 07/09/23.    The 10-year ASCVD risk score (Arnett DK, et al., 2019) is: 9.3%    Assessment & Plan:  Morbid obesity (HCC) -     Semaglutide  (1 MG/DOSE); Inject 1 mg as directed once a week.  Dispense: 3 mL; Refill: 2  Anxiety and depression -     FLUoxetine  HCl; Take 1 capsule (20 mg total) by mouth  daily.  Dispense: 90 capsule; Refill: 3  Depression, recurrent (HCC) -     Amphetamine -Dextroamphetamine ; Take 1 tablet (10 mg total) by mouth 2 (two) times daily.  Dispense: 60 tablet; Refill: 0 -     Amphetamine -Dextroamphetamine ; Take 1 tablet (10 mg total) by mouth 2 (two) times daily.  Dispense: 60 tablet; Refill: 0 -     Amphetamine -Dextroamphetamine ; Take 1 tablet (10 mg total) by mouth 2 (two) times daily.  Dispense: 60 tablet; Refill: 0  Controlled type 2 diabetes mellitus with complication, without long-term current use of insulin  [facility]) Assessment & Plan: 04/13/2023 A1c 7.4%.  On Ozempic  0.5 mg weekly.  Increase to Ozempic  1 mg weekly.  A1c at next office visit.   TIA (transient ischemic attack) Assessment & Plan: She is back on atorvastatin  40 mg daily.  Will check labs in 3 months.  Goal is LDL 70 or less as she has DMT2 and history of a TIA.   Primary hypertension Assessment & Plan: Needs an ACE inhibitor or ARB for protection of kidneys from diabetes.  Will plan to start this  at her next visit.   Obesity, Class III, BMI 40-49.9 (morbid obesity) (HCC) Assessment & Plan: BMI is up to 55.6.  On Ozempic  and Adderall which both have the side effect profile of helping her lose weight.      Return in about 3 months (around 10/08/2023).    Suraj Ramdass K Cecille Mcclusky, MD

## 2023-07-11 NOTE — Assessment & Plan Note (Signed)
 She is back on atorvastatin  40 mg daily.  Will check labs in 3 months.  Goal is LDL 70 or less as she has DMT2 and history of a TIA.

## 2023-07-11 NOTE — Assessment & Plan Note (Signed)
 BMI is up to 55.6.  On Ozempic  and Adderall which both have the side effect profile of helping her lose weight.

## 2023-07-11 NOTE — Assessment & Plan Note (Signed)
 Needs an ACE inhibitor or ARB for protection of kidneys from diabetes.  Will plan to start this at her next visit.

## 2023-07-13 ENCOUNTER — Other Ambulatory Visit: Payer: Self-pay | Admitting: Family Medicine

## 2023-07-13 DIAGNOSIS — E118 Type 2 diabetes mellitus with unspecified complications: Secondary | ICD-10-CM

## 2023-08-02 IMAGING — MR MR HEAD W/ CM
3 series · 10 of 48 positions shown · IV contrast (10 ML GAD)
Comparison: MRI from 06/08/2021.

CLINICAL DATA: Initial evaluation for left-sided ptosis.

EXAM:
MRI HEAD WITH CONTRAST
TECHNIQUE: Multiplanar, multiecho pulse sequences of the brain and surrounding
structures were obtained with intravenous contrast.
CONTRAST:  10mL GADAVIST GADOBUTROL 1 MMOL/ML IV SOLN

[Series 3: T2 post-contrast · coronal · 5.0mm · 0.20mm/px · 4 of 32 slices shown]
[im 1/32]
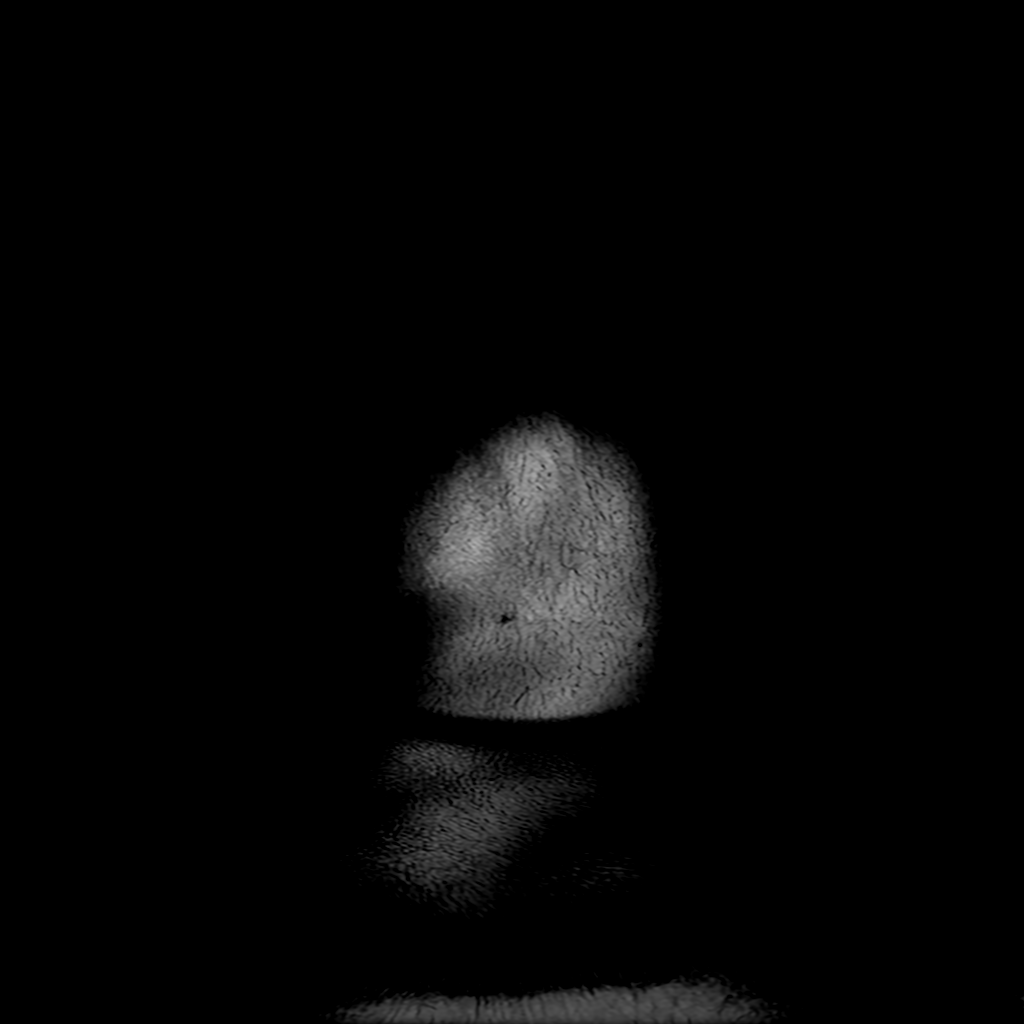
[im 6/32]
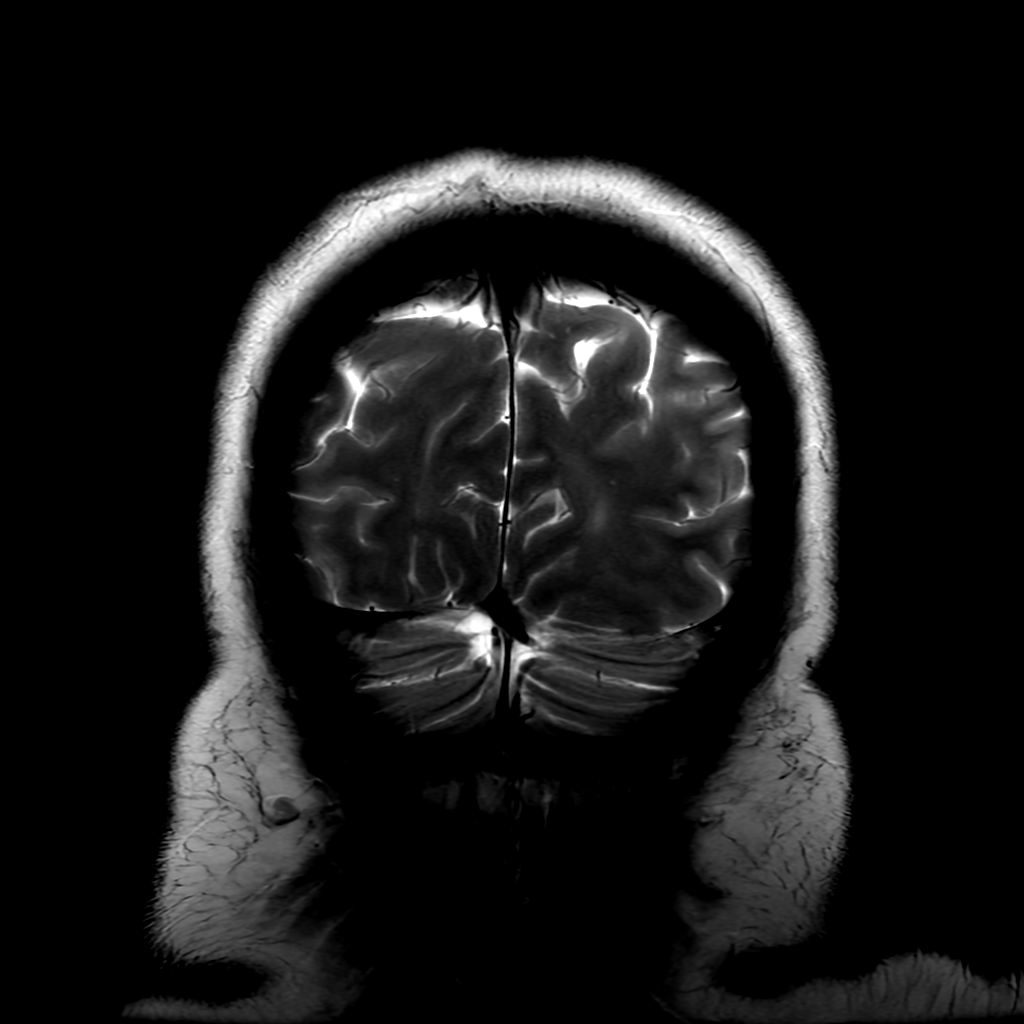
[im 16/32]
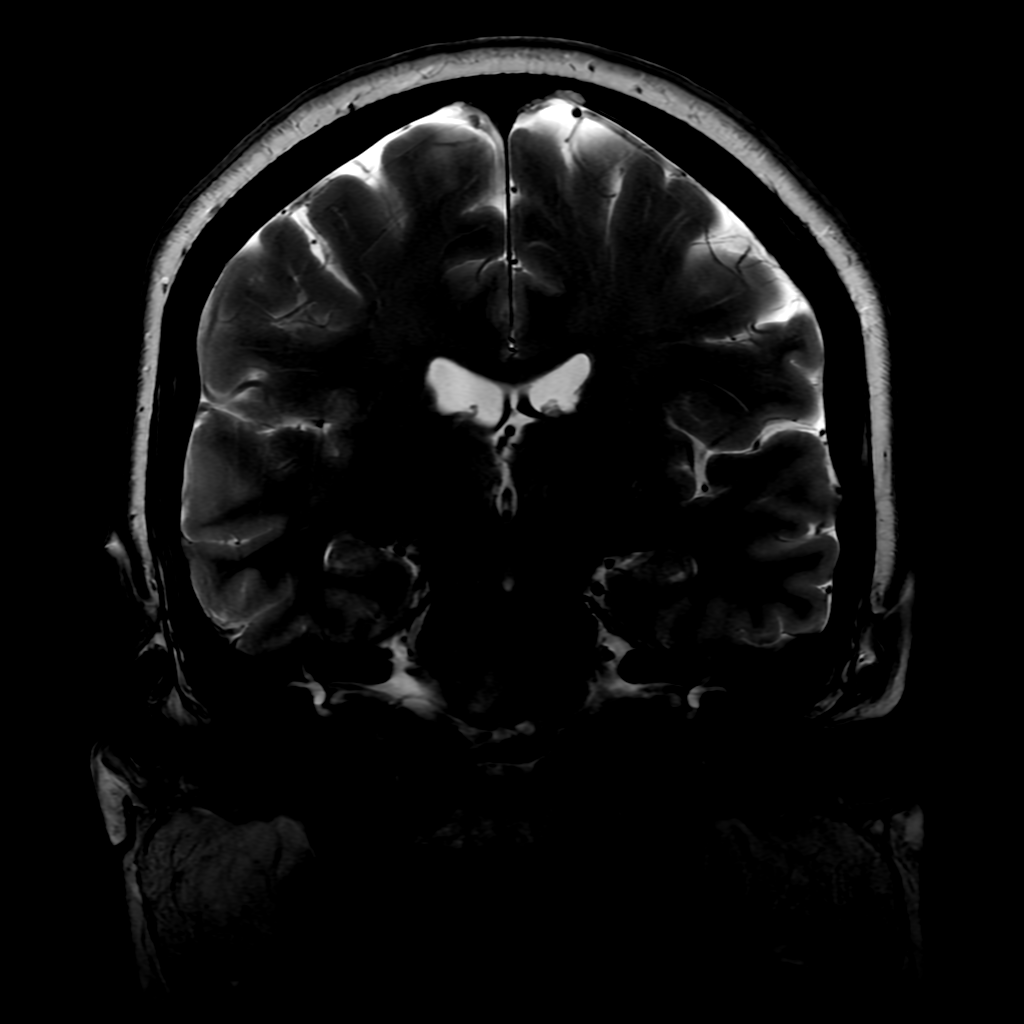
[im 26/32]
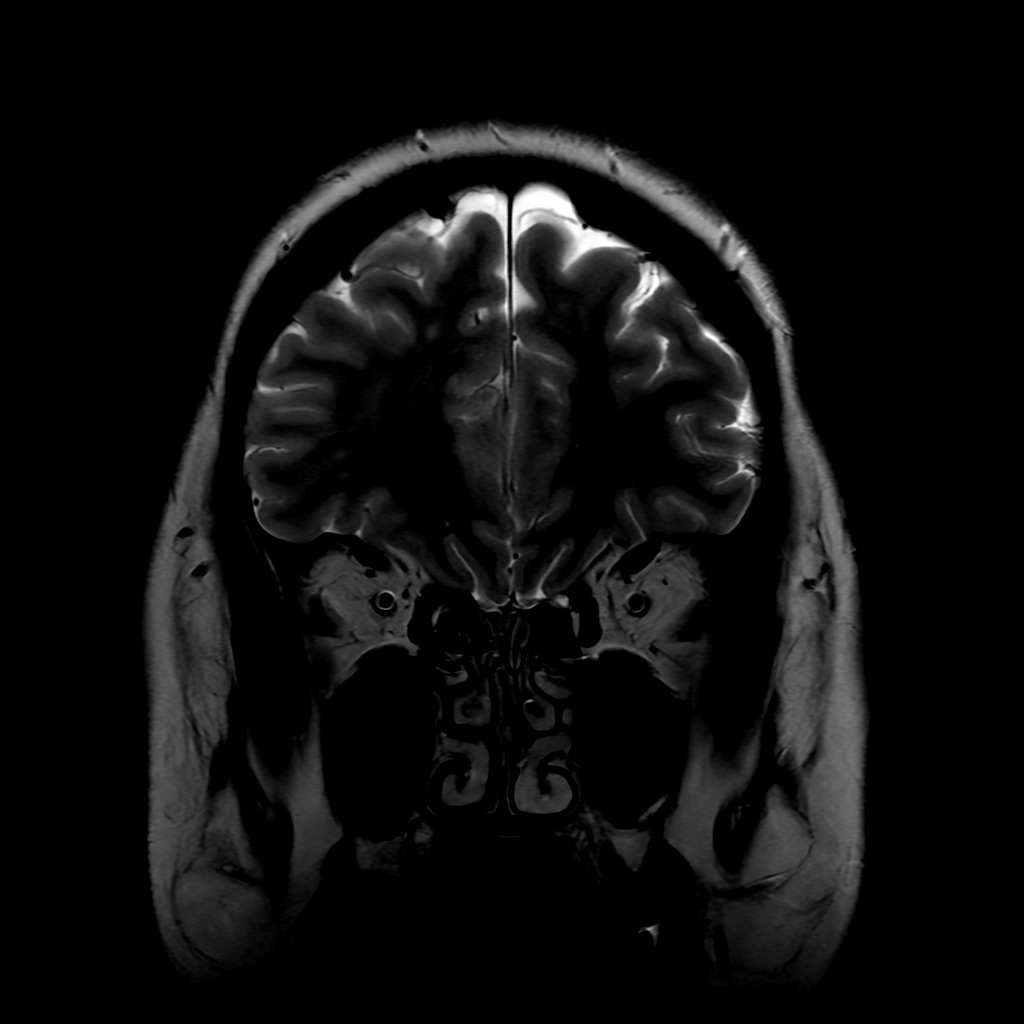

[Series 4: T1 · axial · 3.0mm · 0.94mm/px · z∈[+44,+164]mm · 3 of 56 slices shown (1 of 2)]
[im 8/56]
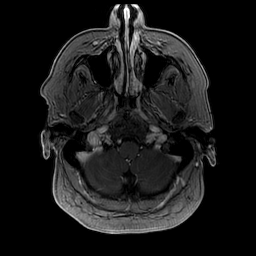
[im 29/56]
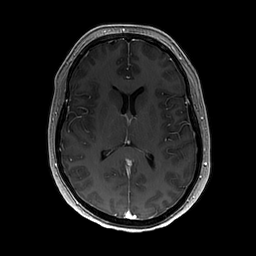
[im 48/56]
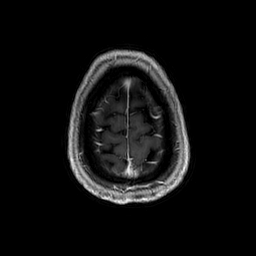

[Series 5: T1 · coronal · 5.0mm · 0.39mm/px · 3 of 32 slices shown (2 of 2)]
[im 6/32]
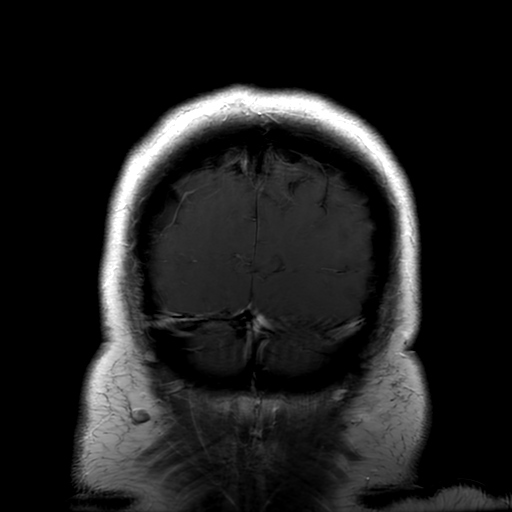
[im 16/32]
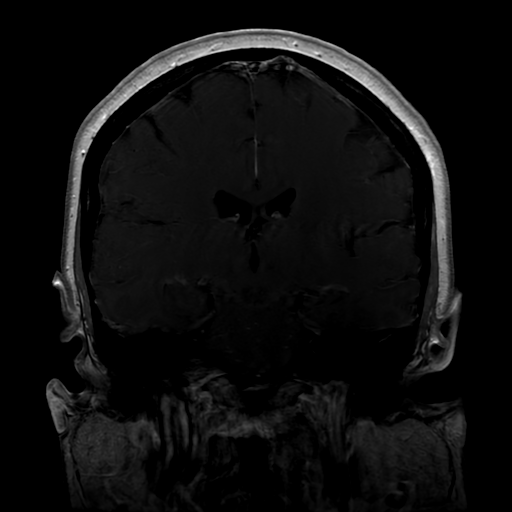
[im 26/32]
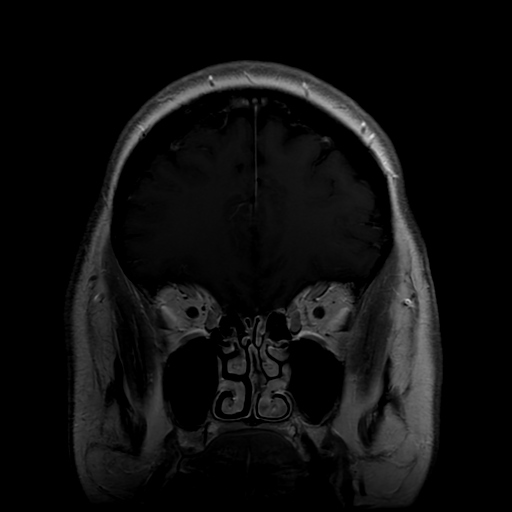

[10 of 48 positions shown; findings below may reference images not displayed]

FINDINGS: Brain: Nonspecific cerebral white matter disease again noted. No
abnormal or pathologic enhancement following contrast
administration. No mass lesion, mass effect, or midline shift. No
hydrocephalus or extra-axial fluid collection.

Vascular: Normal intravascular enhancement seen throughout the
brain.

Skull and upper cervical spine: Craniocervical junction grossly
within normal limits. Calvarium and scalp soft tissues demonstrate
no acute or significant finding.

Sinuses/Orbits: Globes and orbital soft tissues demonstrate no acute
finding.

Other: None.
IMPRESSION: No abnormal enhancement within the brain.

## 2023-08-18 ENCOUNTER — Other Ambulatory Visit: Payer: Self-pay

## 2023-08-18 DIAGNOSIS — K5909 Other constipation: Secondary | ICD-10-CM

## 2023-08-18 MED ORDER — AMITIZA 8 MCG PO CAPS: 8.0000 ug | ORAL_CAPSULE | Freq: Two times a day (BID) | ORAL | 0 refills | Status: DC

## 2023-09-05 DIAGNOSIS — M1712 Unilateral primary osteoarthritis, left knee: Secondary | ICD-10-CM | POA: Insufficient documentation

## 2023-10-01 ENCOUNTER — Ambulatory Visit

## 2023-10-01 DIAGNOSIS — I10 Essential (primary) hypertension: Secondary | ICD-10-CM

## 2023-10-01 DIAGNOSIS — N182 Chronic kidney disease, stage 2 (mild): Secondary | ICD-10-CM

## 2023-10-02 LAB — BASIC METABOLIC PANEL WITH GFR
BUN/Creatinine Ratio: 9 (ref 9–23)
BUN: 9 mg/dL (ref 6–24)
CO2: 21 mmol/L (ref 20–29)
Calcium: 8.9 mg/dL (ref 8.7–10.2)
Chloride: 100 mmol/L (ref 96–106)
Creatinine, Ser: 1 mg/dL (ref 0.57–1.00)
Glucose: 108 mg/dL — ABNORMAL HIGH (ref 70–99)
Potassium: 4.2 mmol/L (ref 3.5–5.2)
Sodium: 137 mmol/L (ref 134–144)
eGFR: 67 mL/min/1.73 (ref >59)

## 2023-10-04 ENCOUNTER — Ambulatory Visit: Admitting: Family Medicine

## 2023-10-04 ENCOUNTER — Encounter: Payer: Self-pay | Admitting: Family Medicine

## 2023-10-04 VITALS — BP 133/87 | HR 101 | Temp 98.7°F | Resp 18 | Ht 60.0 in | Wt 274.0 lb

## 2023-10-04 DIAGNOSIS — U071 COVID-19: Secondary | ICD-10-CM | POA: Diagnosis not present

## 2023-10-04 DIAGNOSIS — J069 Acute upper respiratory infection, unspecified: Secondary | ICD-10-CM

## 2023-10-04 LAB — POC COVID19/FLU A&B COMBO
Covid Antigen, POC: POSITIVE — AB
Influenza A Antigen, POC: NEGATIVE
Influenza B Antigen, POC: NEGATIVE

## 2023-10-04 MED ORDER — NIRMATRELVIR/RITONAVIR (PAXLOVID) TABLET (RENAL DOSING): 2.0000 | ORAL_TABLET | Freq: Two times a day (BID) | ORAL | 0 refills | Status: AC

## 2023-10-04 NOTE — Progress Notes (Signed)
 Established Patient Office Visit  Subjective   Patient ID: Veronica Harding, female    DOB: 1966-02-08  Age: 58 y.o. MRN: [id number]  Chief Complaint  Patient presents with   URI   Headache   Generalized Body Aches   Ear Fullness   Cough    URI  Associated symptoms include coughing, headaches and a plugged ear sensation.  Headache  Associated symptoms include coughing.  Ear Fullness  Associated symptoms include coughing and headaches.  Cough Associated symptoms include headaches.   Delightful 58 year old with anxiety/depression, HTN, DMT2, mixed hyperlipidemia, hypothyroidism, morbid obesity and constipation.   She started feeling bad on Saturday with fever headache congestion slight cough and bodyaches.  At first her rhinorrhea was yellow but now is mostly clear.  She was feverish last night and felt like her eyeballs are going explode.  She did not check her temperature but she was freezing and burning up at the same time. One of her grandchildren has been sick with an upper respiratory infection and she feels like she caught it from them.  She has had no rash.  She denies a sore throat.  She has a cough but it is postnasal drip.  She denies shortness of breath or wheezing.  She does not smoke.  She had a renal function checked and her creatinine is back to 1.00 down from a high of 1.70 in March 2023.    Objective:     BP 133/87 (BP Location: Left Arm, Patient Position: Sitting, Cuff Size: Large)   Pulse (!) 101   Temp 98.7 F (37.1 C) (Oral)   Resp 18   Ht 5' (1.524 m)   Wt 274 lb (124.3 kg)   SpO2 96%   BMI 53.51 kg/m    Physical Exam Vitals and nursing note reviewed.  Constitutional:      Appearance: Normal appearance.  HENT:     Head: Normocephalic and atraumatic.     Mouth/Throat:     Lips: Pink.     Mouth: Mucous membranes are moist.     Pharynx: Oropharynx is clear. Posterior oropharyngeal erythema present.  Eyes:     Conjunctiva/sclera: Conjunctivae  normal.  Cardiovascular:     Rate and Rhythm: Normal rate and regular rhythm.  Pulmonary:     Effort: Pulmonary effort is normal.     Breath sounds: Normal breath sounds.  Musculoskeletal:     Right lower leg: No edema.     Left lower leg: No edema.  Skin:    General: Skin is warm and dry.  Neurological:     Mental Status: She is alert and oriented to person, place, and time.  Psychiatric:        Mood and Affect: Mood normal.        Behavior: Behavior normal.        Thought Content: Thought content normal.        Judgment: Judgment normal.          Results for orders placed or performed in visit on 10/04/23  POC Covid19/Flu A&B Antigen  Result Value Ref Range   Influenza A Antigen, POC Negative Negative   Influenza B Antigen, POC Negative Negative   Covid Antigen, POC Positive (A) Negative      The 10-year ASCVD risk score (Arnett DK, et al., 2019) is: 8.1%    Assessment & Plan:  Upper respiratory tract infection, unspecified type -     POC Covid19/Flu A&B Antigen  COVID-19 virus infection Assessment &  Plan: She is negative for flu AB but positive for COVID-19.  Her lungs are clear on exam.  She does have postnasal drip and a cough from that.  She would like to try Paxlovid .  Gave her the renal dose.  Ask her to stop Prozac , Adderall, atorvastatin , trazodone , Amitiza , amlodipine , famotidine  and naltrexone.  It is okay to continue taking levothyroxine  while you are on Paxlovid .   Please stay in her room away from the other people in your house.  If you have to go to the kitchen or what ever wear a mask and then go right back to your room.  When you have been afebrile for 24 hours or you test negative for COVID you can stop your precautions. If you develop shortness of breath with ambulation or unable to keep down fluids go to the ED.   Other orders -     nirmatrelvir /ritonavir  (renal dosing); Take 2 tablets by mouth 2 (two) times daily for 5 days. (Take nirmatrelvir   150 mg one tablet twice daily for 5 days and ritonavir  100 mg one tablet twice daily for 5 days) Patient GFR is 100  Dispense: 20 tablet; Refill: 0     Return Already sxheduled appointment for routine FU.SABRA    Makari Sanko K Brodrick Curran, MD

## 2023-10-04 NOTE — Assessment & Plan Note (Addendum)
 She is negative for flu AB but positive for COVID-19.  Her lungs are clear on exam.  She does have postnasal drip and a cough from that.  She would like to try Paxlovid .  Gave her the renal dose.  Ask her to stop Prozac , Adderall, atorvastatin , trazodone , Amitiza , amlodipine , famotidine  and naltrexone.  It is okay to continue taking levothyroxine  while you are on Paxlovid .   Please stay in her room away from the other people in your house.  If you have to go to the kitchen or what ever wear a mask and then go right back to your room.  When you have been afebrile for 24 hours or you test negative for COVID you can stop your precautions. If you develop shortness of breath with ambulation or unable to keep down fluids go to the ED.

## 2023-10-08 ENCOUNTER — Ambulatory Visit: Admitting: Family Medicine

## 2023-10-15 ENCOUNTER — Ambulatory Visit: Admitting: Family Medicine

## 2023-10-22 ENCOUNTER — Ambulatory Visit: Admitting: Family Medicine

## 2023-10-22 ENCOUNTER — Telehealth: Payer: Self-pay

## 2023-10-22 ENCOUNTER — Encounter: Payer: Self-pay | Admitting: Family Medicine

## 2023-10-22 VITALS — BP 156/99 | HR 88 | Temp 97.7°F | Resp 18 | Ht 60.0 in | Wt 286.0 lb

## 2023-10-22 DIAGNOSIS — F419 Anxiety disorder, unspecified: Secondary | ICD-10-CM

## 2023-10-22 DIAGNOSIS — F339 Major depressive disorder, recurrent, unspecified: Secondary | ICD-10-CM | POA: Diagnosis not present

## 2023-10-22 DIAGNOSIS — N939 Abnormal uterine and vaginal bleeding, unspecified: Secondary | ICD-10-CM

## 2023-10-22 DIAGNOSIS — N950 Postmenopausal bleeding: Secondary | ICD-10-CM

## 2023-10-22 DIAGNOSIS — E118 Type 2 diabetes mellitus with unspecified complications: Secondary | ICD-10-CM

## 2023-10-22 DIAGNOSIS — F32A Depression, unspecified: Secondary | ICD-10-CM

## 2023-10-22 DIAGNOSIS — M1712 Unilateral primary osteoarthritis, left knee: Secondary | ICD-10-CM

## 2023-10-22 DIAGNOSIS — Z780 Asymptomatic menopausal state: Secondary | ICD-10-CM | POA: Diagnosis not present

## 2023-10-22 DIAGNOSIS — M1710 Unilateral primary osteoarthritis, unspecified knee: Secondary | ICD-10-CM

## 2023-10-22 LAB — POCT GLYCOSYLATED HEMOGLOBIN (HGB A1C): Hemoglobin A1C: 7 % — AB (ref 4.0–5.6)

## 2023-10-22 MED ORDER — AMPHETAMINE-DEXTROAMPHETAMINE 10 MG PO TABS
10.0000 mg | ORAL_TABLET | Freq: Two times a day (BID) | ORAL | 0 refills | Status: DC
Start: 2023-11-20 — End: 2024-01-24

## 2023-10-22 MED ORDER — AMPHETAMINE-DEXTROAMPHETAMINE 10 MG PO TABS: 10.0000 mg | ORAL_TABLET | Freq: Two times a day (BID) | ORAL | 0 refills | Status: DC

## 2023-10-22 MED ORDER — TIRZEPATIDE 2.5 MG/0.5ML ~~LOC~~ SOAJ
2.5000 mg | SUBCUTANEOUS | 0 refills | Status: DC
Start: 2023-10-22 — End: 2023-10-26

## 2023-10-22 NOTE — Assessment & Plan Note (Signed)
 She is not on any current treatment for diabetes and her A1c today is 7.0.  Will start Mounjaro  with a goal of controlling her diabetes and also to encourage weight loss.

## 2023-10-22 NOTE — Assessment & Plan Note (Addendum)
 Reports that she is doing very well.  She is not having crying jags and she does not feel depressed.  She is resting well.  She is on Prozac  20 mg daily and Adderall 10 mg twice daily

## 2023-10-22 NOTE — Telephone Encounter (Signed)
 Has the patient tried and failed or experienced an insufficient response to at least two preferred products, or does the patient have a clinical reason that preferred products cannot be tried?  The preferred drug products include brand Byetta pen, Trulicity pen, brand Victoza pen, Ozempic  injection.*  Patient has only tried and failed Ozempic .  (Key: [id number])  Rx #: [id number]  Mounjaro  2.5MG /0.5ML auto-injectors  Form [facility] [employer] [place]  Medicaid Electronic [place] Form [phone] NCPDP)

## 2023-10-22 NOTE — Assessment & Plan Note (Signed)
 She is postmenopausal and has had vaginal bleeding.  She thought it might be a combination of meloxicam and Prozac  causing her to bleed.  Advised that she has to follow-up with gynecology about this.

## 2023-10-22 NOTE — Progress Notes (Signed)
 Established Patient Office Visit  Subjective   Patient ID: Veronica Harding, female    DOB: 1966-01-28  Age: 58 y.o. MRN: [id number]  Chief Complaint  Patient presents with   Medical Management of Chronic Issues    HPI Veronica Harding 58 year old with anxiety/depression, HTN, DMT2, mixed hyperlipidemia, hypothyroidism, morbid obesity and constipation.         Objective:     BP (!) 156/99   Pulse 88   Temp 97.7 F (36.5 C) (Oral)   Resp 18   Ht 5' (1.524 m)   Wt 286 lb (129.7 kg)   SpO2 97%   BMI 55.86 kg/m    Physical Exam Vitals reviewed.  Constitutional:      Appearance: Normal appearance.  HENT:     Head: Normocephalic.  Eyes:     General:        Right eye: No discharge.        Left eye: No discharge.  Cardiovascular:     Rate and Rhythm: Normal rate.  Pulmonary:     Effort: Pulmonary effort is normal.  Neurological:     Mental Status: Veronica Harding is alert and oriented to person, place, and time.  Psychiatric:        Mood and Affect: Mood normal.        Behavior: Behavior normal.        Thought Content: Thought content normal.        Judgment: Judgment normal.          Results for orders placed or performed in visit on 10/22/23  POCT glycosylated hemoglobin (Hb A1C)  Result Value Ref Range   Hemoglobin A1C 7.0 (A) 4.0 - 5.6 %   HbA1c POC (<> result, manual entry)     HbA1c, POC (prediabetic range)     HbA1c, POC (controlled diabetic range)        The 10-year ASCVD risk score (Arnett DK, et al., 2019) is: 11%    Assessment & Plan:  Controlled diabetes mellitus type 2 with complications, unspecified whether long term insulin  use (HCC) Assessment & Plan: Veronica Harding is not on any current treatment for diabetes and her A1c today is 7.0.  Will start Mounjaro  with a goal of controlling her diabetes and also to encourage weight loss.  Orders: -     POCT glycosylated hemoglobin (Hb A1C) -     Tirzepatide ; Inject 2.5 mg into the skin once a week.  Dispense: 2  mL; Refill: 0  Post-menopausal  Post-menopausal bleeding -     Ambulatory referral to Gynecology  Depression, recurrent (HCC) -     Amphetamine -Dextroamphetamine ; Take 1 tablet (10 mg total) by mouth 2 (two) times daily.  Dispense: 60 tablet; Refill: 0 -     Amphetamine -Dextroamphetamine ; Take 1 tablet (10 mg total) by mouth 2 (two) times daily.  Dispense: 60 tablet; Refill: 0 -     Amphetamine -Dextroamphetamine ; Take 1 tablet (10 mg total) by mouth 2 (two) times daily.  Dispense: 60 tablet; Refill: 0  Anxiety and depression Assessment & Plan: Reports that Veronica Harding is doing very well.  Veronica Harding is not having crying jags and Veronica Harding does not feel depressed.  Veronica Harding is resting well.  Veronica Harding is on Prozac  20 mg daily and Adderall 10 mg twice daily   Primary osteoarthritis of left knee  Vaginal bleeding, abnormal Assessment & Plan: Veronica Harding is postmenopausal and has had vaginal bleeding.  Veronica Harding thought it might be a combination of meloxicam and Prozac  causing her to bleed.  Advised that Veronica Harding has to follow-up with gynecology about this.   Arthritis of knee Assessment & Plan: Having problems with her left knee.  Discussed using Voltaren gel, Salonpas, etc.  Veronica Harding does get some relief of pain from these topicals.  Veronica Harding is taking 500 mg of Tylenol  twice a day for her knee.  Advised to return to orthopedist and see about Synvisc injections.      Return in about 4 weeks (around 11/19/2023).    Avari Gelles K Shatavia Santor, MD

## 2023-10-22 NOTE — Assessment & Plan Note (Signed)
 Having problems with her left knee.  Discussed using Voltaren gel, Salonpas, etc.  She does get some relief of pain from these topicals.  She is taking 500 mg of Tylenol  twice a day for her knee.  Advised to return to orthopedist and see about Synvisc injections.

## 2023-10-23 ENCOUNTER — Other Ambulatory Visit: Payer: Self-pay | Admitting: Family Medicine

## 2023-10-23 DIAGNOSIS — K5909 Other constipation: Secondary | ICD-10-CM

## 2023-10-26 ENCOUNTER — Other Ambulatory Visit: Payer: Self-pay | Admitting: Family Medicine

## 2023-10-26 ENCOUNTER — Inpatient Hospital Stay
Admission: RE | Admit: 2023-10-26 | Discharge: 2023-10-26 | Disposition: A | Discharge status: Home / Self Care | Payer: Self-pay | Source: Ambulatory Visit | Attending: Family Medicine | Admitting: Family Medicine

## 2023-10-26 ENCOUNTER — Other Ambulatory Visit: Payer: Self-pay | Admitting: *Deleted

## 2023-10-26 DIAGNOSIS — Z1231 Encounter for screening mammogram for malignant neoplasm of breast: Secondary | ICD-10-CM

## 2023-10-26 DIAGNOSIS — K5909 Other constipation: Secondary | ICD-10-CM

## 2023-10-26 DIAGNOSIS — E118 Type 2 diabetes mellitus with unspecified complications: Secondary | ICD-10-CM

## 2023-10-26 MED ORDER — TRULICITY 0.75 MG/0.5ML ~~LOC~~ SOAJ
0.7500 mg | SUBCUTANEOUS | 0 refills | Status: DC
Start: 2023-10-26 — End: 2024-01-24

## 2023-10-26 NOTE — Telephone Encounter (Signed)
 Copied from CRM [id number]. Topic: Clinical - Medication Refill >> Oct 26, 2023  4:05 PM Carla L wrote: Medication: AMITIZA  Patient has been out since last Friday of prescription. Patient requesting refill.   Has the patient contacted their pharmacy? Yes Told to contact the office.   This is the patient's preferred pharmacy:  [id number] [address] [facility] - [address] [place] [address] Phone: [phone] Fax: [phone]  Is this the correct pharmacy for this prescription? Yes  Has the prescription been filled recently? No  Is the patient out of the medication? Yes  Has the patient been seen for an appointment in the last year OR does the patient have an upcoming appointment? Yes  Can we respond through MyChart? Yes  Agent: Please be advised that Rx refills may take up to 3 business days. We ask that you follow-up with your pharmacy.

## 2023-11-02 ENCOUNTER — Telehealth: Payer: Self-pay

## 2023-11-02 ENCOUNTER — Other Ambulatory Visit: Payer: Self-pay | Admitting: Family Medicine

## 2023-11-02 DIAGNOSIS — K5909 Other constipation: Secondary | ICD-10-CM

## 2023-11-02 MED ORDER — AMITIZA 8 MCG PO CAPS: 8.0000 ug | ORAL_CAPSULE | Freq: Two times a day (BID) | ORAL | 0 refills | Status: DC

## 2023-11-02 NOTE — Telephone Encounter (Signed)
 Copied from CRM [id number]. Topic: Clinical - Medication Prior Auth >> Nov 02, 2023  3:18 PM Nathanel BROCKS wrote: Reason for CRM:   Dulaglutide  (TRULICITY ) 0.75 MG/0.5ML [facility] is stating that they need a prior authorization for this medication. If ins does not approve pt is wanting to go back on Ozempic . Please advise pt what she needs to do in regards to this matter.

## 2023-11-02 NOTE — Telephone Encounter (Signed)
(  Key: [id number])  PA Case ID #: [id number]  Rx #: [id number]   Status Sent to Plan today  Drug Trulicity  0.75MG /0.5ML auto-injectors  Form [employer] [place]  Medicaid Electronic PA Form [phone] NCPDP) Original Claim Info 75 T/F METFORMIN REQUIRED; SUBMIT PA FOREXCEPTIONFOR 3 DS O/R, USE [facility] [id number] DRUG REQUIRES PRIOR AUTHORIZATION

## 2023-11-05 ENCOUNTER — Encounter

## 2023-11-19 ENCOUNTER — Ambulatory Visit: Admitting: Family Medicine

## 2023-11-20 ENCOUNTER — Encounter: Payer: Self-pay | Admitting: Emergency Medicine

## 2023-11-20 ENCOUNTER — Ambulatory Visit
Admission: EM | Admit: 2023-11-20 | Discharge: 2023-11-20 | Disposition: A | Discharge status: Home / Self Care | Attending: Emergency Medicine | Admitting: Emergency Medicine

## 2023-11-20 DIAGNOSIS — J0100 Acute maxillary sinusitis, unspecified: Secondary | ICD-10-CM

## 2023-11-20 DIAGNOSIS — R051 Acute cough: Secondary | ICD-10-CM

## 2023-11-20 LAB — POC COVID19/FLU A&B COMBO
Covid Antigen, POC: NEGATIVE
Influenza A Antigen, POC: NEGATIVE
Influenza B Antigen, POC: NEGATIVE

## 2023-11-20 MED ORDER — AMOXICILLIN-POT CLAVULANATE 875-125 MG PO TABS
1.0000 | ORAL_TABLET | Freq: Two times a day (BID) | ORAL | 0 refills | Status: DC
Start: 2023-11-20 — End: 2023-12-10

## 2023-11-20 MED ORDER — VENTOLIN HFA 108 (90 BASE) MCG/ACT IN AERS: 1.0000 | INHALATION_SPRAY | Freq: Four times a day (QID) | RESPIRATORY_TRACT | 0 refills | Status: AC | PRN

## 2023-11-20 NOTE — ED Provider Notes (Signed)
 Veronica Harding    CSN: [id number] Arrival date & time: 11/20/23  0940      History   Chief Complaint Chief Complaint  Patient presents with   Cough   Nasal Congestion   Fever   Shortness of Breath    HPI Veronica Harding is a 58 y.o. female.  Patient presents with 4 day history of of fever, congestion, cough, yellow mucus.  She reports mild shortness of breath x 1 day.  She has been treating her symptoms with Tylenol  and Sudafed.  No chest pain, abdominal pain, vomiting, diarrhea.  The history is provided by the patient and medical records.    Past Medical History:  Diagnosis Date   Anxiety and depression    Constipation    Dyslipidemia    GERD (gastroesophageal reflux disease)    Hypertension    Hypothyroidism    Insomnia    Obesity, Class III, BMI 40-49.9 (morbid obesity) 06/08/2021   Sleep apnea    does not wear CPAP    Patient Active Problem List   Diagnosis Date Noted   Vaginal bleeding, abnormal 10/22/2023   Arthritis of knee 10/22/2023   COVID-19 virus infection 10/04/2023   Osteoarthritis of left knee 09/05/2023   Controlled diabetes mellitus type 2 with complications (HCC) 07/11/2023   Stage 2 chronic kidney disease 12/23/2021   Primary hypertension 12/23/2021   Chronic migraine w/o aura w/o status migrainosus, not intractable 08/19/2021   Obstructive sleep apnea 08/19/2021   HLD (hyperlipidemia) 06/09/2021   TIA (transient ischemic attack) 06/08/2021   Anxiety and depression 06/08/2021   Hypothyroidism 06/08/2021   Obesity, Class III, BMI 40-49.9 (morbid obesity) 06/08/2021    Past Surgical History:  Procedure Laterality Date   CHOLECYSTECTOMY     COLONOSCOPY N/A 04/28/2021   Procedure: COLONOSCOPY;  Surgeon: Onita Elspeth Sharper, DO;  Location: [facility]COPY;  Service: Gastroenterology;  Laterality: N/A;  DM    OB History     Gravida  2   Para  2   Term  2   Preterm      AB      Living         SAB      IAB       Ectopic      Multiple      Live Births               Home Medications    Prior to Admission medications   Medication Sig Start Date End Date Taking? Authorizing Provider  albuterol  (VENTOLIN  HFA) 108 (90 Base) MCG/ACT inhaler Inhale 1-2 puffs into the lungs every 6 (six) hours as needed for wheezing or shortness of breath. 11/20/23  Yes Corlis Burnard DEL, NP  amoxicillin -clavulanate (AUGMENTIN ) 875-125 MG tablet Take 1 tablet by mouth every 12 (twelve) hours. 11/20/23  Yes Corlis Burnard DEL, NP  AMITIZA  8 MCG capsule Take 1 capsule (8 mcg total) by mouth 2 (two) times daily. 11/02/23   Ziglar, Susan K, MD  amLODipine  (NORVASC ) 10 MG tablet Take 1 tablet (10 mg total) by mouth daily. 06/14/23   Ziglar, Susan K, MD  amphetamine -dextroamphetamine  (ADDERALL) 10 MG tablet Take 1 tablet (10 mg total) by mouth 2 (two) times daily. 10/22/23   Ziglar, Susan K, MD  amphetamine -dextroamphetamine  (ADDERALL) 10 MG tablet Take 1 tablet (10 mg total) by mouth 2 (two) times daily. 11/20/23   Ziglar, Susan K, MD  amphetamine -dextroamphetamine  (ADDERALL) 10 MG tablet Take 1 tablet (10 mg total) by mouth  2 (two) times daily. 12/18/23   Ziglar, Susan K, MD  aspirin  EC 81 MG tablet Take 81 mg by mouth daily. Swallow whole.    [provider]  atorvastatin  (LIPITOR) 40 MG tablet Take 1 tablet (40 mg total) by mouth daily. 04/16/23   Ziglar, Susan K, MD  Dulaglutide  (TRULICITY ) 0.75 MG/0.5ML SOAJ Inject 0.75 mg into the skin once a week. 10/26/23   Ziglar, Susan K, MD  famotidine  (PEPCID ) 20 MG tablet Take 2 tablets (40 mg total) by mouth daily for 5 days. Patient not taking: Reported on 10/22/2023 05/09/22 05/14/22  Woods, Jaclyn M, PA-C  FLUoxetine  (PROZAC ) 20 MG capsule Take 1 capsule (20 mg total) by mouth daily. 06/11/23   Ziglar, Susan K, MD  FLUoxetine  (PROZAC ) 20 MG capsule Take 1 capsule (20 mg total) by mouth daily. 07/11/23   Ziglar, Susan K, MD  levothyroxine  (SYNTHROID ) 50 MCG tablet Take 1 tablet (50 mcg total)  by mouth daily. 04/16/23   Ziglar, Susan K, MD  metFORMIN (GLUCOPHAGE) 500 MG tablet TAKE 1 TABLET BY MOUTH TWICE A DAY WITH MORNING AND EVENING MEALS    [provider]  mupirocin  ointment (BACTROBAN ) 2 % Apply 1 Application topically 2 (two) times daily. 06/19/23   Corlis Burnard DEL, NP  naltrexone (DEPADE) 50 MG tablet Take 25 mg by mouth daily.    [provider]  traZODone  (DESYREL ) 50 MG tablet Take 1 tablet (50 mg total) by mouth at bedtime. 06/11/23   Ziglar, Devere POUR, MD    Family History Family History  Problem Relation Age of Onset   Stroke Mother 41    Social History Social History   Tobacco Use   Smoking status: Never    Passive exposure: Current   Smokeless tobacco: Never  Vaping Use   Vaping status: Never Used  Substance Use Topics   Alcohol use: Never   Drug use: Never     Allergies   Bacid and Tilactase   Review of Systems Review of Systems  Constitutional:  Positive for fever. Negative for chills.  HENT:  Positive for congestion. Negative for ear pain and sore throat.   Respiratory:  Positive for cough and shortness of breath.   Cardiovascular:  Negative for chest pain and palpitations.  Gastrointestinal:  Negative for abdominal pain, diarrhea and vomiting.     Physical Exam Triage Vital Signs ED Triage Vitals  Encounter Vitals Group     BP 11/20/23 1028 (!) 139/94     Girls Systolic BP Percentile --      Girls Diastolic BP Percentile --      Boys Systolic BP Percentile --      Boys Diastolic BP Percentile --      Pulse Rate 11/20/23 1028 95     Resp 11/20/23 1028 20     Temp 11/20/23 1028 98.1 F (36.7 C)     Temp Source 11/20/23 1028 Oral     SpO2 11/20/23 1028 97 %     Weight 11/20/23 1023 280 lb (127 kg)     Height 11/20/23 1023 5' (1.524 m)     Head Circumference --      Peak Flow --      Pain Score 11/20/23 1022 0     Pain Loc --      Pain Education --      Exclude from Growth Chart --    No data found.  Updated  Vital Signs BP (!) 139/94 (BP Location: Left Arm)  Pulse 95   Temp 98.1 F (36.7 C) (Oral)   Resp 20   Ht 5' (1.524 m)   Wt 280 lb (127 kg)   SpO2 97%   BMI 54.68 kg/m   Visual Acuity Right Eye Distance:   Left Eye Distance:   Bilateral Distance:    Right Eye Near:   Left Eye Near:    Bilateral Near:     Physical Exam Constitutional:      General: She is not in acute distress.    Appearance: She is obese.  HENT:     Right Ear: Tympanic membrane normal.     Left Ear: Tympanic membrane normal.     Nose: Congestion and rhinorrhea present.     Mouth/Throat:     Mouth: Mucous membranes are moist.     Pharynx: Oropharynx is clear.  Cardiovascular:     Rate and Rhythm: Normal rate and regular rhythm.     Heart sounds: Normal heart sounds.  Pulmonary:     Effort: Pulmonary effort is normal. No respiratory distress.     Breath sounds: Normal breath sounds.  Neurological:     Mental Status: She is alert.      UC Treatments / Results  Labs (all labs ordered are listed, but only abnormal results are displayed) Labs Reviewed  POC COVID19/FLU A&B COMBO - Normal    EKG   Radiology No results found.  Procedures Procedures (including critical care time)  Medications Ordered in UC Medications - No data to display  Initial Impression / Assessment and Plan / UC Course  I have reviewed the triage vital signs and the nursing notes.  Pertinent labs & imaging results that were available during my care of the patient were reviewed by me and considered in my medical decision making (see chart for details).    Cough, acute sinusitis.  Lungs are clear and O2 sat is 97% on room air.  No respiratory distress.  Rapid flu and COVID are negative.  Treating today with Augmentin .  Also treating with albuterol  inhaler to be used as needed.  ED precautions discussed.  Instructed patient to follow-up with her PCP on Monday.  She agrees to plan of care.  Final Clinical Impressions(s)  / UC Diagnoses   Final diagnoses:  Acute cough  Acute non-recurrent maxillary sinusitis     Discharge Instructions      The COVID and flu tests are negative.    Take the Augmentin  and use the albuterol  inhaler as directed.    Follow up with your primary care provider on Monday.  Go to the emergency department if you have worsening symptoms.        ED Prescriptions     Medication Sig Dispense Auth. Provider   albuterol  (VENTOLIN  HFA) 108 (90 Base) MCG/ACT inhaler Inhale 1-2 puffs into the lungs every 6 (six) hours as needed for wheezing or shortness of breath. 54 g Corlis Burnard DEL, NP   amoxicillin -clavulanate (AUGMENTIN ) 875-125 MG tablet Take 1 tablet by mouth every 12 (twelve) hours. 14 tablet Corlis Burnard DEL, NP      PDMP not reviewed this encounter.   Corlis Burnard DEL, NP 11/20/23 [phone]

## 2023-11-20 NOTE — ED Triage Notes (Signed)
 Patient in office today complaint of cough, fever, head and chest congestion x4d with yellow mucus also sob x1d  OTC: sudafed and tylenol   Denies:vomitng

## 2023-11-20 NOTE — Discharge Instructions (Addendum)
 The COVID and flu tests are negative.    Take the Augmentin  and use the albuterol  inhaler as directed.    Follow up with your primary care provider on Monday.  Go to the emergency department if you have worsening symptoms.

## 2023-11-30 ENCOUNTER — Ambulatory Visit
Admission: RE | Admit: 2023-11-30 | Discharge: 2023-11-30 | Disposition: A | Discharge status: Home / Self Care | Source: Ambulatory Visit | Attending: Family Medicine | Admitting: Family Medicine

## 2023-11-30 DIAGNOSIS — Z1231 Encounter for screening mammogram for malignant neoplasm of breast: Secondary | ICD-10-CM | POA: Diagnosis present

## 2023-12-07 ENCOUNTER — Ambulatory Visit: Payer: Self-pay | Admitting: *Deleted

## 2023-12-07 NOTE — Telephone Encounter (Signed)
 FYI Only or Action Required?: FYI only for provider.  Patient was last seen in primary care on 10/22/2023 by Ziglar, Susan K, MD.  Called Nurse Triage reporting Leg Swelling.  Symptoms began several days ago.  Interventions attempted: Rest, hydration, or home remedies.  Symptoms are: gradually worsening.  Triage Disposition: See Physician Within 24 Hours  Patient/caregiver understands and will follow disposition?: Yes           Copied from CRM #[id number]. Topic: Clinical - Red Word Triage >> Dec 07, 2023  9:03 AM Ivette P wrote: [facility] that prompted transfer to Nurse Triage:  couple days feet ankles and lower calf have been swelling really bad. referral to dermatology.   sunday, on feet on hard floors. and has had swelling in the past. Sunday was really bad. swelling and tingling and little burning, no rednes.. both legs. Reason for Disposition  [1] MODERATE leg swelling (e.g., swelling extends up to knees) AND [2] new-onset or getting worse  Answer Assessment - Initial Assessment Questions Appt tomorrow. Recommended if sx worsen call back or go to UC/ED.      1. ONSET: When did the swelling start? (e.g., minutes, hours, days)     Sunday  worsening  2. LOCATION: What part of the leg is swollen?  Are both legs swollen or just one leg?     Both legs up to knees  3. SEVERITY: How bad is the swelling? (e.g., localized; mild, moderate, severe)     Moderate  4. REDNESS: Is there redness or signs of infection?     No  5. PAIN: Is the swelling painful to touch? If Yes, ask: How painful is it?   (Scale 1-10; mild, moderate or severe)     Still wearing regular shoes. 3/10  6. FEVER: Do you have a fever? If Yes, ask: What is it, how was it measured, and when did it start?      na 7. CAUSE: What do you think is causing the leg swelling?     Not sure  8. MEDICAL HISTORY: Do you have a history of blood clots (e.g., DVT), cancer, heart failure, kidney  disease, or liver failure?     Na  9. RECURRENT SYMPTOM: Have you had leg swelling before? If Yes, ask: When was the last time? What happened that time?     Yes  10. OTHER SYMPTOMS: Do you have any other symptoms? (e.g., chest pain, difficulty breathing)       Some burning , bilateral leg swelling . No chest pain no difficulty breathing . Reports bumps on face multiple scabbed eraser size getting worse. Had for couple of years and requesting dermatology referral. 11. PREGNANCY: Is there any chance you are pregnant? When was your last menstrual period?       na  Protocols used: Leg Swelling and Edema-A-AH

## 2023-12-08 ENCOUNTER — Ambulatory Visit: Admitting: Family Medicine

## 2023-12-10 ENCOUNTER — Ambulatory Visit: Admitting: Family Medicine

## 2023-12-10 ENCOUNTER — Encounter: Payer: Self-pay | Admitting: Family Medicine

## 2023-12-10 VITALS — BP 134/85 | HR 85 | Temp 97.9°F | Resp 18 | Ht 60.0 in | Wt 278.0 lb

## 2023-12-10 DIAGNOSIS — M1712 Unilateral primary osteoarthritis, left knee: Secondary | ICD-10-CM

## 2023-12-10 DIAGNOSIS — I10 Essential (primary) hypertension: Secondary | ICD-10-CM

## 2023-12-10 DIAGNOSIS — E1159 Type 2 diabetes mellitus with other circulatory complications: Secondary | ICD-10-CM | POA: Diagnosis not present

## 2023-12-10 DIAGNOSIS — L570 Actinic keratosis: Secondary | ICD-10-CM | POA: Insufficient documentation

## 2023-12-10 DIAGNOSIS — R600 Localized edema: Secondary | ICD-10-CM

## 2023-12-10 DIAGNOSIS — I152 Hypertension secondary to endocrine disorders: Secondary | ICD-10-CM | POA: Diagnosis not present

## 2023-12-10 MED ORDER — HYDROCHLOROTHIAZIDE 12.5 MG PO TABS: 12.5000 mg | ORAL_TABLET | Freq: Every day | ORAL | 3 refills | Status: AC

## 2023-12-10 MED ORDER — AMLODIPINE BESYLATE 5 MG PO TABS: 5.0000 mg | ORAL_TABLET | Freq: Every day | ORAL | 1 refills | Status: DC

## 2023-12-10 NOTE — Assessment & Plan Note (Signed)
 She is taking amlodipine  10 mg daily for hypertension.  Her peripheral edema may be a combination of standing, salt in her diet and amlodipine  10 mg daily.  Will change blood pressure medication to amlodipine  5 mg daily and HCTZ 12.5 mg daily.  Please take the HCTZ in the morning.  Will check cardiac echo.

## 2023-12-10 NOTE — Addendum Note (Signed)
 Addended by: Jacey Eckerson K on: 12/10/2023 12:56 PM   Modules accepted: Orders

## 2023-12-10 NOTE — Assessment & Plan Note (Signed)
 Has fair skinned and has multiple actinic keratoses on her face.  Referral to dermatology to have these burned off.

## 2023-12-10 NOTE — Assessment & Plan Note (Signed)
 Taking boswella  and turmeric and reports the arthritis in her knees is not nearly as painful.

## 2023-12-10 NOTE — Progress Notes (Signed)
 Established Patient Office Visit  Subjective   Patient ID: Veronica Harding, female    DOB: June 30, 1965  Age: 58 y.o. MRN: [id number]  Chief Complaint  Patient presents with   Leg Swelling    Bilateral. Worse since 12/05/23.    Skin Problem    HPI Veronica Harding 58 year old with anxiety/depression, HTN, DMT2, mixed hyperlipidemia, hypothyroidism, morbid obesity and constipation.   Discussed the use of AI scribe software for clinical note transcription with the patient, who gave verbal consent to proceed.  History of Present Illness   Veronica Harding is a 58 year old female with hypertension who presents with leg swelling.  She has been experiencing leg swelling, which became more pronounced starting last 01-15-2024. 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 The swelling is associated with burning and tingling sensations. No redness is observed. The swelling subsides overnight, but she experiences frequent nocturia, up to five times.  She is currently taking amlodipine  10 mg daily for hypertension, which she suspects might be contributing to the swelling. Her medication regimen also includes Prozac  20 mg, Trulicity  for diabetes, Adderall, atorvastatin  40 mg for cholesterol, and an 81 mg aspirin . She has recently started taking Boswellia and turmeric with ginger for knee pain, which has significantly reduced her knee pain without causing gastrointestinal upset.  Her family history is significant for heart issues, as her father has had a quadruple bypass and stents. Her family, including her son's girlfriend, is concerned about her leg swelling, suspecting heart issues. She has mild diabetes with A1c of 7.0% one month ago and is aware of the need to manage her cholesterol levels due to increased cardiovascular risk associated with diabetes.  She reports feeling anxious recently, with physical sensations of anxiety, but  prefers not to add more medications to her regimen. She engages in physical activity at work and while playing with her grandchild but does not have a regular exercise routine. [place], which she describes as [place].         Objective:     BP 134/85 (BP Location: Left Arm, Patient Position: Sitting, Cuff Size: Large)   Pulse 85   Temp 97.9 F (36.6 C) (Oral)   Resp 18   Ht 5' (1.524 m)   Wt 278 lb (126.1 kg)   SpO2 98%   BMI 54.29 kg/m    Physical Exam Vitals and nursing note reviewed.  Constitutional:      Appearance: Normal appearance.  HENT:     Head: Normocephalic and atraumatic.  Eyes:     Conjunctiva/sclera: Conjunctivae normal.  Cardiovascular:     Rate and Rhythm: Normal rate and regular rhythm.  Pulmonary:     Effort: Pulmonary effort is normal.     Breath sounds: Normal breath sounds.  Musculoskeletal:        General: Swelling (2+ PE to knee) and tenderness (2+ PE to knee) present.     Right lower leg: No edema.     Left lower leg: No edema.  Skin:    General: Skin is warm and dry.  Neurological:     Mental Status: She is alert and oriented to person, place, and time.  Psychiatric:        Mood and Affect: Mood normal.        Behavior: Behavior normal.        Thought Content: Thought content normal.  Judgment: Judgment normal.          No results found for any visits on 12/10/23.    The 10-year ASCVD risk score (Arnett DK, et al., 2019) is: 8.2%    Assessment & Plan:  Hypertension associated with diabetes (HCC) -     hydroCHLOROthiazide ; Take 1 tablet (12.5 mg total) by mouth daily.  Dispense: 30 tablet; Refill: 3 -     amLODIPine  Besylate; Take 1 tablet (5 mg total) by mouth daily.  Dispense: 90 tablet; Refill: 1 -     CBC with Differential/Platelet -     Comprehensive metabolic panel with GFR -     Lipid panel -     TSH -     T4, free  Actinic keratosis Assessment & Plan: Has fair skinned  and has multiple actinic keratoses on her face.  Referral to dermatology to have these burned off.  Orders: -     Ambulatory referral to Dermatology  Peripheral edema -     ECHOCARDIOGRAM COMPLETE; Future  Primary hypertension Assessment & Plan: She is taking amlodipine  10 mg daily for hypertension.  Her peripheral edema may be a combination of standing, salt in her diet and amlodipine  10 mg daily.  Will change blood pressure medication to amlodipine  5 mg daily and HCTZ 12.5 mg daily.  Please take the HCTZ in the morning.  Will check cardiac echo.   Primary osteoarthritis of left knee Assessment & Plan: Taking boswella  and turmeric and reports the arthritis in her knees is not nearly as painful.      Return in about 3 months (around 03/10/2024).    Cristen Murcia K Davionna Blacksher, MD

## 2023-12-13 ENCOUNTER — Ambulatory Visit

## 2023-12-13 DIAGNOSIS — I10 Essential (primary) hypertension: Secondary | ICD-10-CM

## 2023-12-14 ENCOUNTER — Ambulatory Visit: Payer: Self-pay | Admitting: Family Medicine

## 2023-12-14 LAB — CMP14+EGFR
ALT: 20 IU/L (ref 0–32)
AST: 20 IU/L (ref 0–40)
Albumin: 4.3 g/dL (ref 3.8–4.9)
Alkaline Phosphatase: 171 IU/L — ABNORMAL HIGH (ref 49–135)
BUN/Creatinine Ratio: 15 (ref 9–23)
BUN: 15 mg/dL (ref 6–24)
Bilirubin Total: 0.7 mg/dL (ref 0.0–1.2)
CO2: 21 mmol/L (ref 20–29)
Calcium: 9.1 mg/dL (ref 8.7–10.2)
Chloride: 99 mmol/L (ref 96–106)
Creatinine, Ser: 0.99 mg/dL (ref 0.57–1.00)
Globulin, Total: 2.8 g/dL (ref 1.5–4.5)
Glucose: 107 mg/dL — ABNORMAL HIGH (ref 70–99)
Potassium: 3.8 mmol/L (ref 3.5–5.2)
Sodium: 136 mmol/L (ref 134–144)
Total Protein: 7.1 g/dL (ref 6.0–8.5)
eGFR: 67 mL/min/1.73 (ref >59)

## 2023-12-14 LAB — CBC WITH DIFFERENTIAL/PLATELET
Basophils Absolute: 0.1 x10E3/uL (ref 0.0–0.2)
Basos: 1 %
EOS (ABSOLUTE): 0.1 x10E3/uL (ref 0.0–0.4)
Eos: 2 %
Hematocrit: 41.6 % (ref 34.0–46.6)
Hemoglobin: 13.4 g/dL (ref 11.1–15.9)
Immature Grans (Abs): 0 x10E3/uL (ref 0.0–0.1)
Immature Granulocytes: 0 %
Lymphocytes Absolute: 2.4 x10E3/uL (ref 0.7–3.1)
Lymphs: 28 %
MCH: 26.7 pg (ref 26.6–33.0)
MCHC: 32.2 g/dL (ref 31.5–35.7)
MCV: 83 fL (ref 79–97)
Monocytes Absolute: 0.5 x10E3/uL (ref 0.1–0.9)
Monocytes: 6 %
Neutrophils Absolute: 5.6 x10E3/uL (ref 1.4–7.0)
Neutrophils: 63 %
Platelets: 301 x10E3/uL (ref 150–450)
RBC: 5.01 x10E6/uL (ref 3.77–5.28)
RDW: 14.2 % (ref 11.7–15.4)
WBC: 8.7 x10E3/uL (ref 3.4–10.8)

## 2023-12-14 LAB — TSH+FREE T4
Free T4: 1.6 ng/dL (ref 0.82–1.77)
TSH: 1.78 u[IU]/mL (ref 0.450–4.500)

## 2023-12-14 LAB — LIPID PANEL
Chol/HDL Ratio: 3 ratio (ref 0.0–4.4)
Cholesterol, Total: 128 mg/dL (ref 100–199)
HDL: 43 mg/dL (ref >39)
LDL Chol Calc (NIH): 58 mg/dL (ref 0–99)
Triglycerides: 162 mg/dL — ABNORMAL HIGH (ref 0–149)
VLDL Cholesterol Cal: 27 mg/dL (ref 5–40)

## 2024-01-10 ENCOUNTER — Ambulatory Visit: Admitting: Family Medicine

## 2024-01-18 ENCOUNTER — Ambulatory Visit: Admitting: Family Medicine

## 2024-01-18 ENCOUNTER — Ambulatory Visit: Payer: Self-pay

## 2024-01-18 NOTE — Telephone Encounter (Addendum)
 FYI Only or Action Required?: FYI only for provider: appointment scheduled on 01/18/24.  Patient was last seen in primary care on 12/10/2023 by Ziglar, Susan K, MD.  Called Nurse Triage reporting Cough.  Symptoms began several days ago.  Interventions attempted: Nothing.  Symptoms are: unchanged.  Triage Disposition: See Physician Within 24 Hours  Patient/caregiver understands and will follow disposition?: Yes   Copied from CRM #[id number]. Topic: Clinical - Red Word Triage >> Jan 18, 2024 10:25 AM Tobias CROME wrote: Red Word that prompted transfer to Nurse Triage: congestion, cough, body aches, coughing yellow and green Reason for Disposition  SEVERE coughing spells (e.g., whooping sound after coughing, vomiting after coughing)  Answer Assessment - Initial Assessment Questions 1. ONSET: When did the cough begin?      Week ago 2. SEVERITY: How bad is the cough today?      moderate 3. SPUTUM: Describe the color of your sputum (e.g., none, dry cough; clear, white, yellow, green)     Yellowish green phlegm 4. HEMOPTYSIS: Are you coughing up any blood? If Yes, ask: How much? (e.g., flecks, streaks, tablespoons, etc.)     no 5. DIFFICULTY BREATHING: Are you having difficulty breathing? If Yes, ask: How bad is it? (e.g., mild, moderate, severe)      mild 6. FEVER: Do you have a fever? If Yes, ask: What is your temperature, how was it measured, and when did it start?     Last temp check Saturday, 100.0; low grade 7. CARDIAC HISTORY: Do you have any history of heart disease? (e.g., heart attack, congestive heart failure)      no 8. LUNG HISTORY: Do you have any history of lung disease?  (e.g., pulmonary embolus, asthma, emphysema)     no 9. PE RISK FACTORS: Do you have a history of blood clots? (or: recent major surgery, recent prolonged travel, bedridden)     no 10. OTHER SYMPTOMS: Do you have any other symptoms? (e.g., runny nose, wheezing, chest pain) Bodyache,  congestion, sore throat, denies chest pain, diff breath, dizziness, n/v 12. TRAVEL: Have you traveled out of the country in the last month? (e.g., travel history, exposures)       No expsoure  Protocols used: Cough - Acute Productive-A-AH Advised call back/ UC if symptoms worsen.

## 2024-01-19 ENCOUNTER — Encounter: Payer: Self-pay | Admitting: Internal Medicine

## 2024-01-19 ENCOUNTER — Ambulatory Visit: Admitting: Internal Medicine

## 2024-01-19 VITALS — BP 124/78 | HR 83 | Temp 97.9°F | Ht 60.0 in | Wt 273.0 lb

## 2024-01-19 DIAGNOSIS — J0100 Acute maxillary sinusitis, unspecified: Secondary | ICD-10-CM | POA: Diagnosis not present

## 2024-01-19 MED ORDER — AMOXICILLIN-POT CLAVULANATE 875-125 MG PO TABS: 1.0000 | ORAL_TABLET | Freq: Two times a day (BID) | ORAL | 0 refills | Status: AC

## 2024-01-19 MED ORDER — PROMETHAZINE-DM 6.25-15 MG/5ML PO SYRP: 5.0000 mL | ORAL_SOLUTION | Freq: Four times a day (QID) | ORAL | 0 refills | Status: AC | PRN

## 2024-01-19 NOTE — Progress Notes (Signed)
 Date:  01/19/2024   Name:  Veronica Harding   DOB:  1965-08-15   MRN:  [id number]   Chief Complaint: Cough (1.5 weeks. Cough- yellow/green. Low grade fever, sinus pressure, and headache. )  Cough This is a new problem. The current episode started in the past 7 days (2 weeks). The problem has been unchanged. The cough is Productive of sputum. Associated symptoms include chills, headaches and a sore throat. Pertinent negatives include no chest pain or wheezing.  Sinus Problem This is a new problem. The current episode started in the past 7 days. The problem has been gradually worsening since onset. There has been no fever. The pain is mild. Associated symptoms include chills, congestion, coughing, headaches, sinus pressure and a sore throat. Treatments tried: Mucinex-sinus and Flonase.    Review of Systems  Constitutional:  Positive for chills.  HENT:  Positive for congestion, sinus pressure, sinus pain and sore throat.   Respiratory:  Positive for cough. Negative for chest tightness and wheezing.   Cardiovascular:  Negative for chest pain.  Gastrointestinal:  Positive for nausea. Negative for vomiting.  Neurological:  Positive for headaches.  Psychiatric/Behavioral:  Positive for sleep disturbance.      Lab Results  Component Value Date   NA 136 12/13/2023   K 3.8 12/13/2023   CO2 21 12/13/2023   GLUCOSE 107 (H) 12/13/2023   BUN 15 12/13/2023   CREATININE 0.99 12/13/2023   CALCIUM  9.1 12/13/2023   EGFR 67 12/13/2023   GFRNONAA 43 (L) 06/10/2021   Lab Results  Component Value Date   CHOL 128 12/13/2023   HDL 43 12/13/2023   LDLCALC 58 12/13/2023   TRIG 162 (H) 12/13/2023   CHOLHDL 3.0 12/13/2023   Lab Results  Component Value Date   TSH 1.780 12/13/2023   Lab Results  Component Value Date   HGBA1C 7.0 (A) 10/22/2023   Lab Results  Component Value Date   WBC 8.7 12/13/2023   HGB 13.4 12/13/2023   HCT 41.6 12/13/2023   MCV 83 12/13/2023   PLT 301 12/13/2023    Lab Results  Component Value Date   ALT 20 12/13/2023   AST 20 12/13/2023   ALKPHOS 171 (H) 12/13/2023   BILITOT 0.7 12/13/2023   No results found for: MARIEN BOLLS, VD25OH   Patient Active Problem List   Diagnosis Date Noted   Actinic keratosis 12/10/2023   Vaginal bleeding, abnormal 10/22/2023   Arthritis of knee 10/22/2023   COVID-19 virus infection 10/04/2023   Osteoarthritis of left knee 09/05/2023   Controlled diabetes mellitus type 2 with complications (HCC) 07/11/2023   Stage 2 chronic kidney disease 12/23/2021   Primary hypertension 12/23/2021   Chronic migraine w/o aura w/o status migrainosus, not intractable 08/19/2021   Obstructive sleep apnea 08/19/2021   HLD (hyperlipidemia) 06/09/2021   TIA (transient ischemic attack) 06/08/2021   Anxiety and depression 06/08/2021   Hypothyroidism 06/08/2021   Obesity, Class III, BMI 40-49.9 (morbid obesity) (HCC) 06/08/2021    Allergies  Allergen Reactions   Bacid    Tilactase Other (See Comments)    Past Surgical History:  Procedure Laterality Date   CHOLECYSTECTOMY     COLONOSCOPY N/A 04/28/2021   Procedure: COLONOSCOPY;  Surgeon: Onita Elspeth Sharper, DO;  Location: [facility] ENDOSCOPY;  Service: Gastroenterology;  Laterality: N/A;  DM    Social History   Tobacco Use   Smoking status: Never    Passive exposure: Current   Smokeless tobacco: Never  Vaping Use  Vaping status: Never Used  Substance Use Topics   Alcohol use: Never   Drug use: Never     Medication list has been reviewed and updated.  Current Meds  Medication Sig   albuterol  (VENTOLIN  HFA) 108 (90 Base) MCG/ACT inhaler Inhale 1-2 puffs into the lungs every 6 (six) hours as needed for wheezing or shortness of breath.   AMITIZA  8 MCG capsule Take 1 capsule (8 mcg total) by mouth 2 (two) times daily.   amLODipine  (NORVASC ) 5 MG tablet Take 1 tablet (5 mg total) by mouth daily.   amoxicillin -clavulanate (AUGMENTIN ) 875-125 MG tablet  Take 1 tablet by mouth 2 (two) times daily for 10 days.   amphetamine -dextroamphetamine  (ADDERALL) 10 MG tablet Take 1 tablet (10 mg total) by mouth 2 (two) times daily.   amphetamine -dextroamphetamine  (ADDERALL) 10 MG tablet Take 1 tablet (10 mg total) by mouth 2 (two) times daily.   amphetamine -dextroamphetamine  (ADDERALL) 10 MG tablet Take 1 tablet (10 mg total) by mouth 2 (two) times daily.   aspirin  EC 81 MG tablet Take 81 mg by mouth daily. Swallow whole.   atorvastatin  (LIPITOR) 40 MG tablet Take 1 tablet (40 mg total) by mouth daily.   Dulaglutide  (TRULICITY ) 0.75 MG/0.5ML SOAJ Inject 0.75 mg into the skin once a week.   FLUoxetine  (PROZAC ) 20 MG capsule Take 1 capsule (20 mg total) by mouth daily.   FLUoxetine  (PROZAC ) 20 MG capsule Take 1 capsule (20 mg total) by mouth daily.   hydrochlorothiazide  (HYDRODIURIL ) 12.5 MG tablet Take 1 tablet (12.5 mg total) by mouth daily.   levothyroxine  (SYNTHROID ) 50 MCG tablet Take 1 tablet (50 mcg total) by mouth daily.   metFORMIN (GLUCOPHAGE) 500 MG tablet TAKE 1 TABLET BY MOUTH TWICE A DAY WITH MORNING AND EVENING MEALS   mupirocin  ointment (BACTROBAN ) 2 % Apply 1 Application topically 2 (two) times daily.   naltrexone (DEPADE) 50 MG tablet Take 25 mg by mouth daily.   promethazine -dextromethorphan (PROMETHAZINE -DM) 6.25-15 MG/5ML syrup Take 5 mLs by mouth 4 (four) times daily as needed for up to 9 days for cough.   traZODone  (DESYREL ) 50 MG tablet Take 1 tablet (50 mg total) by mouth at bedtime.       01/19/2024   10:28 AM 12/10/2023   11:01 AM 06/11/2023    1:31 PM 05/14/2023    1:17 PM  GAD 7 : Generalized Anxiety Score  Nervous, Anxious, on Edge 0 1 0 2  Control/stop worrying 0 0 1 2  Worry too much - different things 0 0 1 2  Trouble relaxing 0 0 0 2  Restless 0 0 0 0  Easily annoyed or irritable 0 0 1 1  Afraid - awful might happen 0 0 0 0  Total GAD 7 Score 0 1 3 9   Anxiety Difficulty Not difficult at all Not difficult at all  Somewhat difficult Somewhat difficult       01/19/2024   10:28 AM 12/10/2023   11:01 AM 06/11/2023    1:31 PM  Depression screen PHQ 2/9  Decreased Interest 0 0 1  Down, Depressed, Hopeless 0 0 1  PHQ - 2 Score 0 0 2  Altered sleeping 1 1 1   Tired, decreased energy 1 1 1   Change in appetite 0 0 2  Feeling bad or failure about yourself  0 1 0  Trouble concentrating 0 0 0  Moving slowly or fidgety/restless 0 0 0  Suicidal thoughts 0 0 0  PHQ-9 Score 2 3 6  Difficult doing work/chores Not difficult at all Not difficult at all Somewhat difficult    BP Readings from Last 3 Encounters:  01/19/24 124/78  12/10/23 134/85  11/20/23 (!) 139/94    Physical Exam Constitutional:      Appearance: Normal appearance. She is well-developed.  HENT:     Right Ear: Ear canal and external ear normal. Tympanic membrane is not erythematous or retracted.     Left Ear: Ear canal and external ear normal. Tympanic membrane is not erythematous or retracted.     Nose:     Right Sinus: Maxillary sinus tenderness and frontal sinus tenderness present.     Left Sinus: Maxillary sinus tenderness and frontal sinus tenderness present.     Mouth/Throat:     Mouth: No oral lesions.     Pharynx: Uvula midline. Posterior oropharyngeal erythema present. No oropharyngeal exudate.  Cardiovascular:     Rate and Rhythm: Normal rate and regular rhythm.     Heart sounds: Normal heart sounds.  Pulmonary:     Breath sounds: Normal breath sounds. No decreased breath sounds, wheezing, rhonchi or rales.  Musculoskeletal:     Cervical back: Normal range of motion.  Lymphadenopathy:     Cervical: No cervical adenopathy.  Neurological:     Mental Status: She is alert and oriented to person, place, and time.     Wt Readings from Last 3 Encounters:  01/19/24 273 lb (123.8 kg)  12/10/23 278 lb (126.1 kg)  11/20/23 280 lb (127 kg)    BP 124/78   Pulse 83   Temp 97.9 F (36.6 C) (Oral)   Ht 5' (1.524 m)   Wt 273  lb (123.8 kg)   SpO2 98%   BMI 53.32 kg/m   Assessment and Plan:  Problem List Items Addressed This Visit   None Visit Diagnoses       Acute non-recurrent maxillary sinusitis    -  Primary   continue Mucinex Sinus and Flonase NS push fluids take Augmentin  and cough syrup at night   Relevant Medications   amoxicillin -clavulanate (AUGMENTIN ) 875-125 MG tablet   promethazine -dextromethorphan (PROMETHAZINE -DM) 6.25-15 MG/5ML syrup       No follow-ups on file.    Leita HILARIO Adie, MD [facility] [place]

## 2024-01-24 ENCOUNTER — Encounter: Payer: Self-pay | Admitting: Family Medicine

## 2024-01-24 ENCOUNTER — Encounter (HOSPITAL_BASED_OUTPATIENT_CLINIC_OR_DEPARTMENT_OTHER): Payer: Self-pay

## 2024-01-24 ENCOUNTER — Ambulatory Visit: Admitting: Family Medicine

## 2024-01-24 VITALS — BP 141/90 | HR 86 | Temp 98.0°F | Resp 18 | Ht 60.0 in | Wt 274.0 lb

## 2024-01-24 DIAGNOSIS — F5105 Insomnia due to other mental disorder: Secondary | ICD-10-CM

## 2024-01-24 DIAGNOSIS — F99 Mental disorder, not otherwise specified: Secondary | ICD-10-CM | POA: Diagnosis not present

## 2024-01-24 DIAGNOSIS — F324 Major depressive disorder, single episode, in partial remission: Secondary | ICD-10-CM | POA: Diagnosis not present

## 2024-01-24 DIAGNOSIS — R600 Localized edema: Secondary | ICD-10-CM

## 2024-01-24 DIAGNOSIS — E118 Type 2 diabetes mellitus with unspecified complications: Secondary | ICD-10-CM | POA: Diagnosis not present

## 2024-01-24 DIAGNOSIS — L570 Actinic keratosis: Secondary | ICD-10-CM

## 2024-01-24 MED ORDER — AMPHETAMINE-DEXTROAMPHETAMINE 10 MG PO TABS: 10.0000 mg | ORAL_TABLET | Freq: Two times a day (BID) | ORAL | 0 refills | Status: DC

## 2024-01-24 MED ORDER — TRULICITY 0.75 MG/0.5ML ~~LOC~~ SOAJ: 0.7500 mg | SUBCUTANEOUS | 1 refills | Status: DC

## 2024-01-24 MED ORDER — LISINOPRIL-HYDROCHLOROTHIAZIDE 10-12.5 MG PO TABS
1.0000 | ORAL_TABLET | Freq: Every day | ORAL | 3 refills | Status: AC
Start: 2024-01-24 — End: ?

## 2024-01-24 MED ORDER — TRAZODONE HCL 50 MG PO TABS: 50.0000 mg | ORAL_TABLET | Freq: Every day | ORAL | 1 refills | Status: AC

## 2024-01-24 MED ORDER — FLUOXETINE HCL 20 MG PO CAPS
20.0000 mg | ORAL_CAPSULE | Freq: Every day | ORAL | 3 refills | Status: AC
Start: 2024-01-24 — End: ?

## 2024-01-25 DIAGNOSIS — R600 Localized edema: Secondary | ICD-10-CM | POA: Insufficient documentation

## 2024-01-25 DIAGNOSIS — F324 Major depressive disorder, single episode, in partial remission: Secondary | ICD-10-CM | POA: Insufficient documentation

## 2024-01-25 NOTE — Assessment & Plan Note (Signed)
 Has very fair skin and has actinic keratoses on her face.  Referred her to Dr. Arlyss in [place] for treatment.

## 2024-01-25 NOTE — Assessment & Plan Note (Signed)
 Trulicity  was started but she has not been able to obtain more than a single box.  We wrote her prescription with a refill please come in every 2 months.

## 2024-01-25 NOTE — Assessment & Plan Note (Signed)
 Her Prozac  is written for 20 mg daily but she has only been able to get 10 mg daily.  We wrote it again.  Check the prescription before you pick it up from the pharmacy.  Refilled Adderall 10 mg twice daily for 2 months.

## 2024-01-25 NOTE — Assessment & Plan Note (Signed)
 Think this was a side effect of amlodipine  10 mg daily.  Has an echocardiogram scheduled.
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
## 2024-01-28 ENCOUNTER — Ambulatory Visit: Attending: Family Medicine

## 2024-01-28 DIAGNOSIS — R600 Localized edema: Secondary | ICD-10-CM | POA: Diagnosis present

## 2024-01-28 LAB — ECHOCARDIOGRAM COMPLETE
AR max vel: 2.2 cm2
AV Area VTI: 2.23 cm2
AV Area mean vel: 2.04 cm2
AV Mean grad: 6 mmHg
AV Peak grad: 10.8 mmHg
Ao pk vel: 1.64 m/s
Area-P 1/2: 4.31 cm2
S' Lateral: 3 cm

## 2024-01-28 MED ORDER — PERFLUTREN LIPID MICROSPHERE
1.0000 mL | INTRAVENOUS | Status: AC | PRN
  Administered 2024-01-28: 2 mL via INTRAVENOUS

## 2024-03-24 ENCOUNTER — Ambulatory Visit: Admitting: Family Medicine

## 2024-04-05 ENCOUNTER — Encounter: Payer: Self-pay | Admitting: Family Medicine

## 2024-04-05 ENCOUNTER — Ambulatory Visit: Admitting: Family Medicine

## 2024-04-05 DIAGNOSIS — K5909 Other constipation: Secondary | ICD-10-CM

## 2024-04-05 DIAGNOSIS — E1159 Type 2 diabetes mellitus with other circulatory complications: Secondary | ICD-10-CM | POA: Diagnosis not present

## 2024-04-05 DIAGNOSIS — I152 Hypertension secondary to endocrine disorders: Secondary | ICD-10-CM

## 2024-04-05 DIAGNOSIS — F324 Major depressive disorder, single episode, in partial remission: Secondary | ICD-10-CM

## 2024-04-05 DIAGNOSIS — E118 Type 2 diabetes mellitus with unspecified complications: Secondary | ICD-10-CM | POA: Diagnosis not present

## 2024-04-05 DIAGNOSIS — E039 Hypothyroidism, unspecified: Secondary | ICD-10-CM

## 2024-04-05 MED ORDER — TRULICITY 0.75 MG/0.5ML ~~LOC~~ SOAJ: 0.7500 mg | SUBCUTANEOUS | 1 refills | Status: AC

## 2024-04-05 MED ORDER — AMITIZA 8 MCG PO CAPS: 8.0000 ug | ORAL_CAPSULE | Freq: Two times a day (BID) | ORAL | 0 refills | Status: AC

## 2024-04-05 MED ORDER — ATORVASTATIN CALCIUM 40 MG PO TABS: 40.0000 mg | ORAL_TABLET | Freq: Every day | ORAL | 3 refills | Status: AC

## 2024-04-05 MED ORDER — LEVOTHYROXINE SODIUM 50 MCG PO TABS: 50.0000 ug | ORAL_TABLET | Freq: Every day | ORAL | 3 refills | Status: AC

## 2024-04-05 MED ORDER — AMPHETAMINE-DEXTROAMPHETAMINE 10 MG PO TABS: 10.0000 mg | ORAL_TABLET | Freq: Two times a day (BID) | ORAL | 0 refills | Status: AC

## 2024-04-05 MED ORDER — AMLODIPINE BESYLATE 5 MG PO TABS: 5.0000 mg | ORAL_TABLET | Freq: Every day | ORAL | 1 refills | Status: AC

## 2024-04-05 NOTE — Progress Notes (Signed)
 "  Established Patient Office Visit  Subjective   Patient ID: Veronica Harding, female    DOB: 17-Nov-1965  Age: 59 y.o. MRN: [id number]  Chief Complaint  Patient presents with   Medical Management of Chronic Issues    Follow up.     HPI Discussed the use of AI scribe software for clinical note transcription with the patient, who gave verbal consent to proceed.  History of Present Illness   Veronica Harding is a 60 year old female with hypertension and diabetes who presents for medication management and follow-up.  Her blood pressure was 107 over an unknown diastolic value today.  She is currently taking fluoxetine  20 mg and feels 'pretty good' on it. She also takes Adderall, sometimes twice a day, but often only once due to its stimulating effects, which she prefers to avoid near bedtime.  She is prescribed Trulicity  for diabetes but uses it inconsistently, often forgetting to take it weekly and has a full box at home. She does not check her blood sugars regularly.  She has not visited the eye doctor recently, which is important given her diabetes. She is aware that she might ask for her A1c levels.  She has not heard back from dermatology despite attempts to contact them for skin issues, including lesions she picks at and others that need evaluation.  She underwent an echocardiogram and her LVEF was 60-65%.  She  experienced discomfort during the procedure due to excessive pressure applied by the technician, resulting in skin tearing and bleeding.  She reports persistent nasal congestion for two months, describing it as 'stuck behind my eyeballs.' She has tried various treatments, including Mucinex, but finds them ineffective. She experiences choking and coughing at night due to this congestion.  She requests refills for Adderall, Amitiza  for constipation, and levothyroxine  for thyroid management.       Objective:     BP 107/75   Pulse 91   Temp 97.7 F (36.5 C) (Oral)   Resp  16   Ht 5' (1.524 m)   Wt 265 lb 3.2 oz (120.3 kg)   SpO2 96%   BMI 51.79 kg/m    Physical Exam Vitals and nursing note reviewed.  Constitutional:      Appearance: Normal appearance.  HENT:     Head: Normocephalic and atraumatic.  Eyes:     Conjunctiva/sclera: Conjunctivae normal.  Cardiovascular:     Rate and Rhythm: Normal rate and regular rhythm.  Pulmonary:     Effort: Pulmonary effort is normal.     Breath sounds: Normal breath sounds.  Musculoskeletal:     Right lower leg: No edema.     Left lower leg: No edema.  Skin:    General: Skin is warm and dry.  Neurological:     Mental Status: She is alert and oriented to person, place, and time.  Psychiatric:        Mood and Affect: Mood normal.        Behavior: Behavior normal.        Thought Content: Thought content normal.        Judgment: Judgment normal.          No results found for any visits on 04/05/24.    The ASCVD Risk score (Arnett DK, et al., 2019) failed to calculate for the following reasons:   The valid total cholesterol range is 130 to 320 mg/dL    Assessment & Plan:  Other constipation -  Amitiza ; Take 1 capsule (8 mcg total) by mouth 2 (two) times daily.  Dispense: 180 capsule; Refill: 0  Depression, major, single episode, in partial remission -     Amphetamine -Dextroamphetamine ; Take 1 tablet (10 mg total) by mouth 2 (two) times daily.  Dispense: 60 tablet; Refill: 0 -     Amphetamine -Dextroamphetamine ; Take 1 tablet (10 mg total) by mouth 2 (two) times daily.  Dispense: 60 tablet; Refill: 0 -     Amphetamine -Dextroamphetamine ; Take 1 tablet (10 mg total) by mouth 2 (two) times daily.  Dispense: 60 tablet; Refill: 0  Controlled type 2 diabetes mellitus with complication, without long-term current use of insulin  (HCC) -     Atorvastatin  Calcium ; Take 1 tablet (40 mg total) by mouth daily.  Dispense: 90 tablet; Refill: 3 -     Trulicity ; Inject 0.75 mg into the skin once a week.  Dispense:  2 mL; Refill: 1 -     Comprehensive metabolic panel with GFR -     Hemoglobin A1c  Acquired hypothyroidism -     Levothyroxine  Sodium; Take 1 tablet (50 mcg total) by mouth daily.  Dispense: 90 tablet; Refill: 3  Hypertension associated with diabetes (HCC) -     amLODIPine  Besylate; Take 1 tablet (5 mg total) by mouth daily.  Dispense: 90 tablet; Refill: 1 -     CBC with Differential/Platelet -     Lipid panel -     TSH -     T4, free     Return in about 3 months (around 07/04/2024) for A1c.    Mccartney Chuba K Doratha Mcswain, MD "

## 2024-04-06 ENCOUNTER — Ambulatory Visit: Payer: Self-pay | Admitting: Family Medicine

## 2024-04-06 LAB — COMPREHENSIVE METABOLIC PANEL WITH GFR
ALT: 18 IU/L (ref 0–32)
AST: 16 IU/L (ref 0–40)
Albumin: 4 g/dL (ref 3.8–4.9)
Alkaline Phosphatase: 167 IU/L — ABNORMAL HIGH (ref 49–135)
BUN/Creatinine Ratio: 18 (ref 9–23)
BUN: 22 mg/dL (ref 6–24)
Bilirubin Total: 0.4 mg/dL (ref 0.0–1.2)
CO2: 19 mmol/L — ABNORMAL LOW (ref 20–29)
Calcium: 9 mg/dL (ref 8.7–10.2)
Chloride: 98 mmol/L (ref 96–106)
Creatinine, Ser: 1.23 mg/dL — ABNORMAL HIGH (ref 0.57–1.00)
Globulin, Total: 3.2 g/dL (ref 1.5–4.5)
Glucose: 117 mg/dL — ABNORMAL HIGH (ref 70–99)
Potassium: 4.6 mmol/L (ref 3.5–5.2)
Sodium: 133 mmol/L — ABNORMAL LOW (ref 134–144)
Total Protein: 7.2 g/dL (ref 6.0–8.5)
eGFR: 52 mL/min/1.73 — ABNORMAL LOW

## 2024-04-06 LAB — CBC WITH DIFFERENTIAL/PLATELET
Basophils Absolute: 0.1 x10E3/uL (ref 0.0–0.2)
Basos: 1 %
EOS (ABSOLUTE): 0.2 x10E3/uL (ref 0.0–0.4)
Eos: 2 %
Hematocrit: 40.4 % (ref 34.0–46.6)
Hemoglobin: 13 g/dL (ref 11.1–15.9)
Immature Grans (Abs): 0 x10E3/uL (ref 0.0–0.1)
Immature Granulocytes: 0 %
Lymphocytes Absolute: 2.2 x10E3/uL (ref 0.7–3.1)
Lymphs: 26 %
MCH: 26.6 pg (ref 26.6–33.0)
MCHC: 32.2 g/dL (ref 31.5–35.7)
MCV: 83 fL (ref 79–97)
Monocytes Absolute: 0.5 x10E3/uL (ref 0.1–0.9)
Monocytes: 5 %
Neutrophils Absolute: 5.6 x10E3/uL (ref 1.4–7.0)
Neutrophils: 66 %
Platelets: 334 x10E3/uL (ref 150–450)
RBC: 4.88 x10E6/uL (ref 3.77–5.28)
RDW: 14.2 % (ref 11.7–15.4)
WBC: 8.6 x10E3/uL (ref 3.4–10.8)

## 2024-04-06 LAB — TSH: TSH: 1.53 u[IU]/mL (ref 0.450–4.500)

## 2024-04-06 LAB — LIPID PANEL
Chol/HDL Ratio: 3.2 ratio (ref 0.0–4.4)
Cholesterol, Total: 122 mg/dL (ref 100–199)
HDL: 38 mg/dL — ABNORMAL LOW
LDL Chol Calc (NIH): 52 mg/dL (ref 0–99)
Triglycerides: 192 mg/dL — ABNORMAL HIGH (ref 0–149)
VLDL Cholesterol Cal: 32 mg/dL (ref 5–40)

## 2024-04-06 LAB — T4, FREE: Free T4: 1.33 ng/dL (ref 0.82–1.77)

## 2024-04-06 LAB — HEMOGLOBIN A1C
Est. average glucose Bld gHb Est-mCnc: 151 mg/dL
Hgb A1c MFr Bld: 6.9 % — ABNORMAL HIGH (ref 4.8–5.6)

## 2024-07-05 ENCOUNTER — Ambulatory Visit: Admitting: Family Medicine
